# Patient Record
Sex: Female | Born: 1975 | Race: Black or African American | Hispanic: No | Marital: Single | Smoking: Never smoker
Health system: Southern US, Community
[De-identification: ages and names within clinical notes are randomized; demographics above are authoritative.]

## PROBLEM LIST (undated history)

## (undated) ENCOUNTER — Inpatient Hospital Stay (HOSPITAL_COMMUNITY): Payer: Self-pay

## (undated) DIAGNOSIS — J45909 Unspecified asthma, uncomplicated: Secondary | ICD-10-CM

## (undated) DIAGNOSIS — A749 Chlamydial infection, unspecified: Secondary | ICD-10-CM

## (undated) DIAGNOSIS — G4730 Sleep apnea, unspecified: Secondary | ICD-10-CM

## (undated) DIAGNOSIS — O169 Unspecified maternal hypertension, unspecified trimester: Secondary | ICD-10-CM

## (undated) DIAGNOSIS — Z789 Other specified health status: Secondary | ICD-10-CM

## (undated) HISTORY — DX: Unspecified maternal hypertension, unspecified trimester: O16.9

## (undated) HISTORY — PX: INDUCED ABORTION: SHX677

## (undated) HISTORY — DX: Unspecified asthma, uncomplicated: J45.909

---
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
## 1999-09-01 ENCOUNTER — Inpatient Hospital Stay (HOSPITAL_COMMUNITY): Admission: AD | Admit: 1999-09-01 | Discharge: 1999-09-01 | Payer: Self-pay | Admitting: *Deleted

## 1999-12-08 ENCOUNTER — Inpatient Hospital Stay (HOSPITAL_COMMUNITY): Admission: AD | Admit: 1999-12-08 | Discharge: 1999-12-09 | Payer: Self-pay | Admitting: Obstetrics

## 1999-12-30 ENCOUNTER — Other Ambulatory Visit: Admission: RE | Admit: 1999-12-30 | Discharge: 1999-12-30 | Payer: Self-pay | Admitting: Obstetrics & Gynecology

## 1999-12-30 ENCOUNTER — Other Ambulatory Visit: Admission: RE | Admit: 1999-12-30 | Discharge: 1999-12-30 | Payer: Self-pay | Admitting: Gynecology

## 2000-03-04 ENCOUNTER — Emergency Department (HOSPITAL_COMMUNITY): Admission: EM | Admit: 2000-03-04 | Discharge: 2000-03-04 | Payer: Self-pay

## 2000-06-03 ENCOUNTER — Inpatient Hospital Stay (HOSPITAL_COMMUNITY): Admission: AD | Admit: 2000-06-03 | Discharge: 2000-06-03 | Payer: Self-pay | Admitting: *Deleted

## 2000-07-30 ENCOUNTER — Inpatient Hospital Stay (HOSPITAL_COMMUNITY): Admission: AD | Admit: 2000-07-30 | Discharge: 2000-08-01 | Payer: Self-pay | Admitting: Obstetrics and Gynecology

## 2000-08-03 ENCOUNTER — Encounter: Admission: RE | Admit: 2000-08-03 | Discharge: 2000-09-02 | Payer: Self-pay | Admitting: Obstetrics and Gynecology

## 2000-09-17 ENCOUNTER — Inpatient Hospital Stay (HOSPITAL_COMMUNITY): Admission: AD | Admit: 2000-09-17 | Discharge: 2000-09-17 | Payer: Self-pay | Admitting: Obstetrics & Gynecology

## 2000-10-11 ENCOUNTER — Emergency Department (HOSPITAL_COMMUNITY): Admission: EM | Admit: 2000-10-11 | Discharge: 2000-10-11 | Payer: Self-pay | Admitting: Emergency Medicine

## 2001-03-29 ENCOUNTER — Other Ambulatory Visit: Admission: RE | Admit: 2001-03-29 | Discharge: 2001-03-29 | Payer: Self-pay | Admitting: Family Medicine

## 2002-03-06 ENCOUNTER — Inpatient Hospital Stay (HOSPITAL_COMMUNITY): Admission: AD | Admit: 2002-03-06 | Discharge: 2002-03-06 | Payer: Self-pay | Admitting: Obstetrics and Gynecology

## 2002-03-17 ENCOUNTER — Emergency Department (HOSPITAL_COMMUNITY): Admission: EM | Admit: 2002-03-17 | Discharge: 2002-03-17 | Payer: Self-pay

## 2002-04-20 ENCOUNTER — Inpatient Hospital Stay (HOSPITAL_COMMUNITY): Admission: AD | Admit: 2002-04-20 | Discharge: 2002-04-20 | Payer: Self-pay | Admitting: *Deleted

## 2003-07-18 ENCOUNTER — Inpatient Hospital Stay (HOSPITAL_COMMUNITY): Admission: AD | Admit: 2003-07-18 | Discharge: 2003-07-18 | Payer: Self-pay | Admitting: Family Medicine

## 2003-08-01 ENCOUNTER — Ambulatory Visit (HOSPITAL_COMMUNITY): Admission: RE | Admit: 2003-08-01 | Discharge: 2003-08-01 | Payer: Self-pay | Admitting: Family Medicine

## 2003-08-06 ENCOUNTER — Other Ambulatory Visit: Admission: RE | Admit: 2003-08-06 | Discharge: 2003-08-06 | Payer: Self-pay | Admitting: Obstetrics and Gynecology

## 2003-08-14 ENCOUNTER — Emergency Department (HOSPITAL_COMMUNITY): Admission: EM | Admit: 2003-08-14 | Discharge: 2003-08-14 | Payer: Self-pay | Admitting: *Deleted

## 2004-04-05 ENCOUNTER — Emergency Department (HOSPITAL_COMMUNITY): Admission: EM | Admit: 2004-04-05 | Discharge: 2004-04-05 | Payer: Self-pay | Admitting: Emergency Medicine

## 2004-05-01 ENCOUNTER — Emergency Department (HOSPITAL_COMMUNITY): Admission: EM | Admit: 2004-05-01 | Discharge: 2004-05-01 | Payer: Self-pay | Admitting: Emergency Medicine

## 2005-02-18 ENCOUNTER — Other Ambulatory Visit: Admission: RE | Admit: 2005-02-18 | Discharge: 2005-02-18 | Payer: Self-pay | Admitting: Obstetrics and Gynecology

## 2005-04-03 ENCOUNTER — Emergency Department (HOSPITAL_COMMUNITY): Admission: EM | Admit: 2005-04-03 | Discharge: 2005-04-03 | Payer: Self-pay | Admitting: Emergency Medicine

## 2005-05-30 ENCOUNTER — Inpatient Hospital Stay (HOSPITAL_COMMUNITY): Admission: AD | Admit: 2005-05-30 | Discharge: 2005-05-30 | Payer: Self-pay | Admitting: Obstetrics and Gynecology

## 2005-06-08 ENCOUNTER — Ambulatory Visit (HOSPITAL_COMMUNITY): Admission: RE | Admit: 2005-06-08 | Discharge: 2005-06-08 | Payer: Self-pay | Admitting: Obstetrics and Gynecology

## 2005-06-14 ENCOUNTER — Ambulatory Visit: Payer: Self-pay | Admitting: *Deleted

## 2005-08-01 ENCOUNTER — Inpatient Hospital Stay (HOSPITAL_COMMUNITY): Admission: AD | Admit: 2005-08-01 | Discharge: 2005-08-01 | Payer: Self-pay | Admitting: Obstetrics and Gynecology

## 2005-10-03 ENCOUNTER — Inpatient Hospital Stay (HOSPITAL_COMMUNITY): Admission: AD | Admit: 2005-10-03 | Discharge: 2005-10-05 | Payer: Self-pay | Admitting: Obstetrics and Gynecology

## 2005-10-08 ENCOUNTER — Inpatient Hospital Stay (HOSPITAL_COMMUNITY): Admission: AD | Admit: 2005-10-08 | Discharge: 2005-10-09 | Payer: Self-pay | Admitting: Obstetrics & Gynecology

## 2005-10-17 ENCOUNTER — Inpatient Hospital Stay (HOSPITAL_COMMUNITY): Admission: AD | Admit: 2005-10-17 | Discharge: 2005-10-18 | Payer: Self-pay | Admitting: Obstetrics and Gynecology

## 2005-10-21 ENCOUNTER — Encounter: Admission: RE | Admit: 2005-10-21 | Discharge: 2005-11-20 | Payer: Self-pay | Admitting: Obstetrics and Gynecology

## 2005-11-21 ENCOUNTER — Encounter: Admission: RE | Admit: 2005-11-21 | Discharge: 2005-12-20 | Payer: Self-pay | Admitting: Obstetrics and Gynecology

## 2005-12-21 ENCOUNTER — Encounter: Admission: RE | Admit: 2005-12-21 | Discharge: 2006-01-20 | Payer: Self-pay | Admitting: Obstetrics and Gynecology

## 2006-01-21 ENCOUNTER — Encounter: Admission: RE | Admit: 2006-01-21 | Discharge: 2006-02-19 | Payer: Self-pay | Admitting: Obstetrics and Gynecology

## 2006-01-26 ENCOUNTER — Inpatient Hospital Stay (HOSPITAL_COMMUNITY): Admission: AD | Admit: 2006-01-26 | Discharge: 2006-01-26 | Payer: Self-pay | Admitting: Obstetrics and Gynecology

## 2006-02-20 ENCOUNTER — Encounter: Admission: RE | Admit: 2006-02-20 | Discharge: 2006-03-22 | Payer: Self-pay | Admitting: Obstetrics and Gynecology

## 2006-03-23 ENCOUNTER — Encounter: Admission: RE | Admit: 2006-03-23 | Discharge: 2006-04-22 | Payer: Self-pay | Admitting: Obstetrics and Gynecology

## 2006-10-28 ENCOUNTER — Emergency Department (HOSPITAL_COMMUNITY): Admission: EM | Admit: 2006-10-28 | Discharge: 2006-10-28 | Payer: Self-pay | Admitting: Emergency Medicine

## 2007-02-18 ENCOUNTER — Emergency Department (HOSPITAL_COMMUNITY): Admission: EM | Admit: 2007-02-18 | Discharge: 2007-02-18 | Payer: Self-pay | Admitting: Emergency Medicine

## 2008-03-24 ENCOUNTER — Emergency Department (HOSPITAL_COMMUNITY): Admission: EM | Admit: 2008-03-24 | Discharge: 2008-03-24 | Payer: Self-pay | Admitting: Emergency Medicine

## 2008-05-24 ENCOUNTER — Emergency Department (HOSPITAL_COMMUNITY): Admission: EM | Admit: 2008-05-24 | Discharge: 2008-05-24 | Payer: Self-pay | Admitting: Emergency Medicine

## 2010-03-08 NOTE — L&D Delivery Note (Signed)
Pt progressed rapidly after amniotomy to c/c/o.  Pt had an epidural. Pt pushed three times. She had a SVD over intact perineum in ROA position.  Placenta S/I. Baby weighed 5-10.  Baby to NBN.

## 2010-03-29 ENCOUNTER — Encounter: Payer: Self-pay | Admitting: Obstetrics and Gynecology

## 2010-05-11 ENCOUNTER — Inpatient Hospital Stay (HOSPITAL_COMMUNITY)
Admission: AD | Admit: 2010-05-11 | Discharge: 2010-05-11 | Disposition: A | Discharge status: Home / Self Care | Payer: BC Managed Care – PPO | Source: Ambulatory Visit | Attending: Obstetrics and Gynecology | Admitting: Obstetrics and Gynecology

## 2010-05-11 ENCOUNTER — Inpatient Hospital Stay (HOSPITAL_COMMUNITY): Payer: BC Managed Care – PPO

## 2010-05-11 DIAGNOSIS — O209 Hemorrhage in early pregnancy, unspecified: Secondary | ICD-10-CM | POA: Insufficient documentation

## 2010-06-08 LAB — RUBELLA ANTIBODY, IGM: Rubella: IMMUNE

## 2010-06-08 LAB — ANTIBODY SCREEN: Antibody Screen: NEGATIVE

## 2010-06-08 LAB — HIV ANTIBODY (ROUTINE TESTING W REFLEX): HIV: NONREACTIVE

## 2010-06-08 LAB — HEPATITIS B SURFACE ANTIGEN: Hepatitis B Surface Ag: NEGATIVE

## 2010-06-08 LAB — ABO/RH: RH Type: POSITIVE

## 2010-06-18 LAB — POCT RAPID STREP A (OFFICE): Streptococcus, Group A Screen (Direct): NEGATIVE

## 2010-07-24 NOTE — Discharge Summary (Signed)
Julie Copeland, Julie Copeland             ACCOUNT NO.:  [id number]   MEDICAL RECORD NO.:  [id number]          PATIENT TYPE:  INP   LOCATION:  9127                          FACILITY:  [facility]   PHYSICIAN:  Gerrit Friends. Aldona Bar, M.D.   DATE OF BIRTH:  20-May-1975   DATE OF ADMISSION:  10/03/2005  DATE OF DISCHARGE:  10/05/2005                                 DISCHARGE SUMMARY   DISCHARGE DIAGNOSES:  1.  Term pregnancy, delivered 8 pound 4 ounce female infant, Apgars 9 and 9.  2.  Blood type, B positive.  3.  Positive antenatal group B strep.   PROCEDURES:  1  Normal spontaneous delivery.  1.  Second-degree tear and repair.   SUMMARY:  This 35 year old female, gravida 4, para 1 with a due date of  August 5 was admitted in labor at term.  Her pregnancy was complicated by  pre-term contractions.  There was also a history of group B strep and indeed  there was a question of her cervical length perhaps needing a cerclage.  A  group B strep culture was done when the patient was approximately [redacted] weeks  pregnant and was positive.  The patient had a consultation with Dr. Gavin Potters,  who recommended no cerclage but treatment of her group B strep with  intravenous ampicillin, which was arranged through home care.  At [redacted] weeks  gestation, bacterial vaginosis was treated with oral Flagyl.  A fetal  fibronectin at [redacted] weeks gestation was negative.  This was done because the  patient was having some contractions.  The remainder of her pregnancy was  relatively benign.  After admission, she was begun on intravenous  intrapartum penicillin per protocol and she progressed rapidly after  ruptured membranes and had a delivery of an 8 pound 4 ounce female infant  with Apgars of 9 and 9 over second-degree tear, which was repaired without  difficulty.  Her postpartum course was benign with the exception of some  mild hypertension.  On the morning of July 31, her blood pressure was in the  range of 150-140/90.  The patient was  entirely asymptomatic.  Nonetheless,  she was begun on labetalol 200 mg every 8 hours for a total of 10 days and  was asked to stop by the office and have her pressure checked periodically.  She was also given a prescription for Tylox to use 1-2 every 4-6 hours as  needed for severe pain and Motrin 600 mg to use every 6 hours as needed for  cramping or less severe pain.  She will continue her prenatal vitamins.  Her  baby was doing well and was discharged with the mother.  Mother's discharge  hemoglobin was 11.6 with a white count of 10,900, platelet count of 141,000.  She was given all appropriate instructions per discharge brochure at the  time of discharge and understood all instructions well.   CONDITION ON DISCHARGE:  Improved.   FOLLOW-UP:  In the office in approximately 4 weeks hence.      Gerrit Friends. Aldona Bar, M.D.  Electronically Signed     RMW/MEDQ  D:  10/05/2005  T:  10/05/2005  Job:  [id number]

## 2010-08-04 ENCOUNTER — Other Ambulatory Visit (HOSPITAL_COMMUNITY): Payer: Self-pay | Admitting: Obstetrics and Gynecology

## 2010-08-04 DIAGNOSIS — Z3689 Encounter for other specified antenatal screening: Secondary | ICD-10-CM

## 2010-08-27 ENCOUNTER — Ambulatory Visit (HOSPITAL_COMMUNITY): Payer: BC Managed Care – PPO

## 2010-09-09 ENCOUNTER — Inpatient Hospital Stay (HOSPITAL_COMMUNITY)
Admission: AD | Admit: 2010-09-09 | Discharge: 2010-09-09 | Disposition: A | Discharge status: Home / Self Care | Payer: BC Managed Care – PPO | Source: Ambulatory Visit | Attending: Obstetrics & Gynecology | Admitting: Obstetrics & Gynecology

## 2010-09-09 DIAGNOSIS — R109 Unspecified abdominal pain: Secondary | ICD-10-CM | POA: Insufficient documentation

## 2010-09-09 DIAGNOSIS — K5289 Other specified noninfective gastroenteritis and colitis: Secondary | ICD-10-CM | POA: Insufficient documentation

## 2010-09-09 DIAGNOSIS — O99891 Other specified diseases and conditions complicating pregnancy: Secondary | ICD-10-CM | POA: Insufficient documentation

## 2010-09-09 DIAGNOSIS — O9989 Other specified diseases and conditions complicating pregnancy, childbirth and the puerperium: Secondary | ICD-10-CM

## 2010-09-09 LAB — URINALYSIS, ROUTINE W REFLEX MICROSCOPIC
Bilirubin Urine: NEGATIVE
Glucose, UA: NEGATIVE mg/dL
Hgb urine dipstick: NEGATIVE
Leukocytes, UA: NEGATIVE
Nitrite: NEGATIVE
Protein, ur: NEGATIVE mg/dL

## 2010-10-08 ENCOUNTER — Telehealth: Payer: Self-pay | Admitting: Pulmonary Disease

## 2010-10-08 NOTE — Telephone Encounter (Signed)
Pt was referred by Dr. Ilda Mori, OBGYN.  Called and spoke with pt. Pt scheduled for Pulmonary Consult with [facility] for Friday at 11:30 am.

## 2010-10-09 ENCOUNTER — Ambulatory Visit (INDEPENDENT_AMBULATORY_CARE_PROVIDER_SITE_OTHER): Payer: BC Managed Care – PPO | Admitting: Pulmonary Disease

## 2010-10-09 ENCOUNTER — Encounter: Payer: Self-pay | Admitting: Pulmonary Disease

## 2010-10-09 VITALS — BP 150/72 | HR 100 | Temp 98.7°F | Ht 67.0 in | Wt 234.4 lb

## 2010-10-09 DIAGNOSIS — G4733 Obstructive sleep apnea (adult) (pediatric): Secondary | ICD-10-CM

## 2010-10-09 NOTE — Progress Notes (Signed)
  Subjective:    Patient ID: Julie Copeland, female    DOB: March 22, 1975, 35 y.o.   MRN: [id number]  HPI The pt is a 35y/o female who I have been asked to see for possible sleep apnea.  She is 30 weeks with IUP, and the question has been raised by spouse and healthworkers.   Her history is significant for the following: -snoring that has worsened since pregnancy, along with an abnormal breathing pattern during sleep. -frequent awakenings, but sometimes due to being uncomfortable or having issues breathing. -nonrestorative sleep, and significant daytime sleepiness with periods of inactivity -dozes with tv/movies, takes naps in afternoons -no sleepiness with driving. -weight has increased 15 pounds over the last 2 yrs  Sleep Questionnaire: What time do you typically go to bed?( Between what hours) Midnight How long does it take you to fall asleep? 1 hour How many times during the night do you wake up? 5 What time do you get out of bed to start your day? 0700 Do you drive or operate heavy machinery in your occupation? No How much has your weight changed (up or down) over the past two years? (In pounds) 15 lb (6.804 kg) Have you ever had a sleep study before? No Do you currently use CPAP? No Do you wear oxygen at any time? No    Review of Systems  Constitutional: Negative for fever and unexpected weight change.  HENT: Negative for ear pain, nosebleeds, congestion, sore throat, rhinorrhea, sneezing, trouble swallowing, dental problem, postnasal drip and sinus pressure.   Eyes: Negative for redness and itching.  Respiratory: Positive for shortness of breath. Negative for cough, chest tightness and wheezing.   Cardiovascular: Negative for palpitations and leg swelling.  Gastrointestinal: Negative for nausea and vomiting.  Genitourinary: Negative for dysuria.  Musculoskeletal: Negative for joint swelling.  Skin: Negative for rash.  Neurological: Negative for headaches.  Hematological: Does not  bruise/bleed easily.  Psychiatric/Behavioral: Negative for dysphoric mood. The patient is not nervous/anxious.        Objective:   Physical Exam Constitutional:  Pregnant female, no acute distress  HENT:  Nares patent without discharge, but enlarged turbinates  Oropharynx without exudate, palate very elongated, uvula normal  Eyes:  Perrla, eomi, no scleral icterus  Neck:  No JVD, no TMG  Cardiovascular:  tachycardic, regular rhythm, no rubs or gallops.  No murmurs        Intact distal pulses  Pulmonary :  Normal breath sounds, no stridor or respiratory distress   No rales, rhonchi, or wheezing  Abdominal:  Soft, nondistended, bowel sounds present.  No tenderness noted.   Musculoskeletal:  No lower extremity edema noted.  Lymph Nodes:  No cervical lymphadenopathy noted  Skin:  No cyanosis noted  Neurologic:  Alert, appropriate, moves all 4 extremities without obvious deficit.         Assessment & Plan:

## 2010-10-09 NOTE — Patient Instructions (Signed)
Will try and get home sleep testing precertified with your insurance.  If able, will contact you once results available.  If not, then will schedule for study at sleep center.

## 2010-10-15 ENCOUNTER — Encounter: Payer: Self-pay | Admitting: Pulmonary Disease

## 2010-10-15 NOTE — Assessment & Plan Note (Signed)
The pt's history is suggestive of osa, and with her pregnancy, will be important to make sure she is not having significant nocturnal desaturations with this.  I have had a long discussion with the pt about sleep apnea, including its impact on QOL and CV health. I think her history is concerning enough to proceed with a sleep study, and she agrees.

## 2010-10-19 ENCOUNTER — Telehealth: Payer: Self-pay | Admitting: Pulmonary Disease

## 2010-10-19 NOTE — Telephone Encounter (Signed)
KC sent order to [facility] on 8/3 for home sleep study.  Called and spoke with pt.  Pt states she still hasn't heard from our office regarding getting set up with the apnea link.  PCCs, please advise asap.  Thanks.

## 2010-10-20 NOTE — Telephone Encounter (Signed)
Attempted to contact patient on 10/16/10 to arrange for home sleep study and the phone number I was given was temporary disconnected. [employer] has approved the home sleep study and will contact patient today to arrange.

## 2010-10-20 NOTE — Telephone Encounter (Signed)
Contacted patient and advised patient that [employer] did approve study and home sleep study has been set up for Thurs. 10/22/10. Pt will come after 1:00. Pt is aware.

## 2010-10-23 ENCOUNTER — Ambulatory Visit (INDEPENDENT_AMBULATORY_CARE_PROVIDER_SITE_OTHER): Payer: BC Managed Care – PPO | Admitting: Pulmonary Disease

## 2010-10-23 DIAGNOSIS — G4733 Obstructive sleep apnea (adult) (pediatric): Secondary | ICD-10-CM

## 2010-11-02 ENCOUNTER — Telehealth: Payer: Self-pay | Admitting: Pulmonary Disease

## 2010-11-02 NOTE — Progress Notes (Signed)
The patient underwent home sleep testing with a type III portable device.  Airflow, effort, oxygen saturation, and pulse were all monitored during this time.  The patient's raw data and tracings have been reviewed with the following findings:  #1 flow evaluation time of 5 hours 47 minutes. #2 the patient had no apneas, and only 9 obstructive hypopneas.  This gave her an apnea hypopnea index of 2 events per hour. #3 the patient had oxygen desaturation as low as 79%.  She spent 13 minutes less than or equal to 88%.

## 2010-11-02 NOTE — Telephone Encounter (Signed)
Called patient on her home and cell phone numbers, left message on machine to call us back so that we could discuss her sleep study results.

## 2010-11-02 NOTE — Assessment & Plan Note (Signed)
The patient does not have clinically significant sleep apnea, but does have desaturation which would be best treated with oxygen in the setting of pregnancy.  We'll start the patient on supplemental oxygen just during her pregnancy, and suspect she will not require this once her baby is delivered.

## 2010-11-05 ENCOUNTER — Other Ambulatory Visit: Payer: Self-pay | Admitting: Pulmonary Disease

## 2010-11-05 DIAGNOSIS — G4736 Sleep related hypoventilation in conditions classified elsewhere: Secondary | ICD-10-CM | POA: Insufficient documentation

## 2010-11-05 NOTE — Telephone Encounter (Signed)
Pt had very transient desat to 79%, and less than 88% No significant sleep apnea. Will arrange for oxygen until after her delivery date, then d/c.  Pt made aware, order sent to [facility].

## 2010-11-12 ENCOUNTER — Encounter: Payer: Self-pay | Admitting: Pulmonary Disease

## 2010-11-13 ENCOUNTER — Encounter (HOSPITAL_COMMUNITY): Payer: Self-pay | Admitting: *Deleted

## 2010-11-13 ENCOUNTER — Observation Stay (HOSPITAL_COMMUNITY)
Admission: AD | Admit: 2010-11-13 | Discharge: 2010-11-13 | Disposition: A | Discharge status: Home / Self Care | Payer: BC Managed Care – PPO | Source: Ambulatory Visit | Attending: Obstetrics and Gynecology | Admitting: Obstetrics and Gynecology

## 2010-11-13 DIAGNOSIS — IMO0002 Reserved for concepts with insufficient information to code with codable children: Secondary | ICD-10-CM | POA: Insufficient documentation

## 2010-11-13 DIAGNOSIS — R030 Elevated blood-pressure reading, without diagnosis of hypertension: Secondary | ICD-10-CM | POA: Insufficient documentation

## 2010-11-13 DIAGNOSIS — O99891 Other specified diseases and conditions complicating pregnancy: Principal | ICD-10-CM | POA: Insufficient documentation

## 2010-11-13 HISTORY — DX: Other specified health status: Z78.9

## 2010-11-13 LAB — URINALYSIS, MICROSCOPIC ONLY
Protein, ur: NEGATIVE mg/dL
Urobilinogen, UA: 1 mg/dL (ref 0.0–1.0)

## 2010-11-13 LAB — COMPREHENSIVE METABOLIC PANEL
Calcium: 8.9 mg/dL (ref 8.4–10.5)
GFR calc Af Amer: >60 mL/min (ref >60)
Glucose, Bld: 64 mg/dL — ABNORMAL LOW (ref 70–99)
Potassium: 3.5 mEq/L (ref 3.5–5.1)
Sodium: 133 mEq/L — ABNORMAL LOW (ref 135–145)
Total Protein: 5.7 g/dL — ABNORMAL LOW (ref 6.0–8.3)

## 2010-11-13 LAB — URIC ACID: Uric Acid, Serum: 4.8 mg/dL (ref 2.4–7.0)

## 2010-11-13 LAB — CBC
MCH: 32.2 pg (ref 26.0–34.0)
RBC: 3.79 MIL/uL — ABNORMAL LOW (ref 3.87–5.11)

## 2010-11-13 NOTE — Progress Notes (Signed)
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

## 2010-11-27 NOTE — Progress Notes (Signed)
Encounter addended by: Elaina Hoops, RN on: 11/27/2010 11:45 PM<BR>     Documentation filed: Charges VN

## 2010-12-01 ENCOUNTER — Inpatient Hospital Stay (HOSPITAL_COMMUNITY): Payer: BC Managed Care – PPO

## 2010-12-01 ENCOUNTER — Encounter (HOSPITAL_COMMUNITY): Payer: Self-pay | Admitting: *Deleted

## 2010-12-01 ENCOUNTER — Inpatient Hospital Stay (HOSPITAL_COMMUNITY)
Admission: AD | Admit: 2010-12-01 | Discharge: 2010-12-04 | DRG: 372 | Disposition: A | Discharge status: Home / Self Care | Payer: BC Managed Care – PPO | Source: Ambulatory Visit | Attending: Obstetrics & Gynecology | Admitting: Obstetrics & Gynecology

## 2010-12-01 DIAGNOSIS — Z22330 Carrier of Group B streptococcus: Secondary | ICD-10-CM

## 2010-12-01 DIAGNOSIS — O99892 Other specified diseases and conditions complicating childbirth: Secondary | ICD-10-CM | POA: Diagnosis present

## 2010-12-01 DIAGNOSIS — O139 Gestational [pregnancy-induced] hypertension without significant proteinuria, unspecified trimester: Principal | ICD-10-CM | POA: Diagnosis present

## 2010-12-01 HISTORY — DX: Chlamydial infection, unspecified: A74.9

## 2010-12-01 LAB — URINALYSIS, ROUTINE W REFLEX MICROSCOPIC
Glucose, UA: NEGATIVE mg/dL
Nitrite: NEGATIVE
Protein, ur: NEGATIVE mg/dL
Urobilinogen, UA: 0.2 mg/dL (ref 0.0–1.0)

## 2010-12-01 LAB — COMPREHENSIVE METABOLIC PANEL
ALT: 20 U/L (ref 0–35)
AST: 28 U/L (ref 0–37)
BUN: 9 mg/dL (ref 6–23)
Chloride: 101 mEq/L (ref 96–112)
Creatinine, Ser: 0.49 mg/dL — ABNORMAL LOW (ref 0.50–1.10)
GFR calc non Af Amer: >60 mL/min (ref >60)
Glucose, Bld: 100 mg/dL — ABNORMAL HIGH (ref 70–99)
Total Protein: 6.2 g/dL (ref 6.0–8.3)

## 2010-12-01 LAB — CBC
HCT: 37 % (ref 36.0–46.0)
Platelets: 163 10*3/uL (ref 150–400)
WBC: 7.3 10*3/uL (ref 4.0–10.5)

## 2010-12-01 LAB — URIC ACID: Uric Acid, Serum: 5.1 mg/dL (ref 2.4–7.0)

## 2010-12-01 NOTE — ED Provider Notes (Signed)
Chief Complaint:  Hypertension   Julie Copeland is  35 y.o. [phone].  [redacted]w[redacted]d    She presents complaining of Hypertension Pt states that she was in the office today and Dr. Arlyce Dice had instructed her to come into MAU tonight for additional monitoring of the baby. Denies HA, visual disturbance, epigastric pain, nausea or vomiting. Report good FM  Obstetrical/Gynecological History: OB History    Grav Para Term Preterm Abortions TAB SAB Ect Mult Living   7 2 2  0 3 2 1  0 0 2      Past Medical History: Past Medical History  Diagnosis Date  . Hypertension in pregnancy   . Childhood asthma   . No pertinent past medical history     Past Surgical History: Past Surgical History  Procedure Date  . Wisdom tooth extraction 1997  . Induced abortion 2000 and 2006  . No past surgeries     Family History: Family History  Problem Relation Age of Onset  . Heart disease Mother   . Heart disease Father   . Asthma Brother     Social History: History  Substance Use Topics  . Smoking status: Never Smoker   . Smokeless tobacco: Never Used  . Alcohol Use: No    Allergies: No Known Allergies  Prescriptions prior to admission  Medication Sig Dispense Refill  . prenatal vitamin w/FE, FA (PRENATAL 1 + 1) 27-1 MG TABS Take 1 tablet by mouth daily.        . Prenatal Vit-Fe Fumarate-FA (PRENATAL MULTIVITAMIN) 60-1 MG tablet Take 1 tablet by mouth daily.          Review of Systems - Negative except for what has been reviewed in the HPI  Physical Exam   Blood pressure 150/84, pulse 99, temperature 98.3 F (36.8 C), resp. rate 20, height 5\' 5"  (1.651 m), weight 107.049 kg (236 lb).  General: General appearance - alert, well appearing, and in no distress, oriented to person, place, and time and pregnant, facial edema Nose - normal and patent, no erythema, discharge or polyps Abdomen - soft, nontender, nondistended, no masses or organomegaly gravid Focused Gynecological Exam: examination  not indicated FHR: 130, mod variability, + 10 x10 accels. Non-reactive Toco: no ctx  Labs: Recent Results (from the past 24 hour(s))  CBC   Collection Time   12/01/10 10:42 PM      Component Value Range   WBC 7.3  4.0 - 10.5 (K/uL)   RBC 3.87  3.87 - 5.11 (MIL/uL)   Hemoglobin 12.5  12.0 - 15.0 (g/dL)   HCT 45.4  09.8 - 11.9 (%)   MCV 95.6  78.0 - 100.0 (fL)   MCH 32.3  26.0 - 34.0 (pg)   MCHC 33.8  30.0 - 36.0 (g/dL)   RDW 14.7  82.9 - 56.2 (%)   Platelets 163  150 - 400 (K/uL)  COMPREHENSIVE METABOLIC PANEL   Collection Time   12/01/10 10:42 PM      Component Value Range   Sodium 133 (*) 135 - 145 (mEq/L)   Potassium 3.6  3.5 - 5.1 (mEq/L)   Chloride 101  96 - 112 (mEq/L)   CO2 23  19 - 32 (mEq/L)   Glucose, Bld 100 (*) 70 - 99 (mg/dL)   BUN 9  6 - 23 (mg/dL)   Creatinine, Ser 1.30 (*) 0.50 - 1.10 (mg/dL)   Calcium 9.4  8.4 - 86.5 (mg/dL)   Total Protein 6.2  6.0 - 8.3 (g/dL)  Albumin 2.6 (*) 3.5 - 5.2 (g/dL)   AST 28  0 - 37 (U/L)   ALT 20  0 - 35 (U/L)   Alkaline Phosphatase 177 (*) 39 - 117 (U/L)   Total Bilirubin 0.4  0.3 - 1.2 (mg/dL)   GFR calc non Af Amer >60  >60 (mL/min)   GFR calc Af Amer >60  >60 (mL/min)  URIC ACID   Collection Time   12/01/10 10:42 PM      Component Value Range   Uric Acid, Serum 5.1  2.4 - 7.0 (mg/dL)   Imaging Studies:  No results found.   MD Consult: Discussed with Dr. Arlyce Dice. Orders received for BPP and Pre-x labs.  Assessment: Patient Active Problem List  Diagnoses  . OSA (obstructive sleep apnea)  . Nocturnal hypoxemia due to emphysema    Plan: Care assumed by Georges Mouse, CNM  [facility] E. 12/01/2010,11:46 PM    Results for orders placed during the hospital encounter of 12/01/10 (from the past 24 hour(s))  CBC     Status: Normal   Collection Time   12/01/10 10:42 PM      Component Value Range   WBC 7.3  4.0 - 10.5 (K/uL)   RBC 3.87  3.87 - 5.11 (MIL/uL)   Hemoglobin 12.5  12.0 - 15.0 (g/dL)   HCT 40.9   81.1 - 91.4 (%)   MCV 95.6  78.0 - 100.0 (fL)   MCH 32.3  26.0 - 34.0 (pg)   MCHC 33.8  30.0 - 36.0 (g/dL)   RDW 78.2  95.6 - 21.3 (%)   Platelets 163  150 - 400 (K/uL)  COMPREHENSIVE METABOLIC PANEL     Status: Abnormal   Collection Time   12/01/10 10:42 PM      Component Value Range   Sodium 133 (*) 135 - 145 (mEq/L)   Potassium 3.6  3.5 - 5.1 (mEq/L)   Chloride 101  96 - 112 (mEq/L)   CO2 23  19 - 32 (mEq/L)   Glucose, Bld 100 (*) 70 - 99 (mg/dL)   BUN 9  6 - 23 (mg/dL)   Creatinine, Ser 0.86 (*) 0.50 - 1.10 (mg/dL)   Calcium 9.4  8.4 - 57.8 (mg/dL)   Total Protein 6.2  6.0 - 8.3 (g/dL)   Albumin 2.6 (*) 3.5 - 5.2 (g/dL)   AST 28  0 - 37 (U/L)   ALT 20  0 - 35 (U/L)   Alkaline Phosphatase 177 (*) 39 - 117 (U/L)   Total Bilirubin 0.4  0.3 - 1.2 (mg/dL)   GFR calc non Af Amer >60  >60 (mL/min)   GFR calc Af Amer >60  >60 (mL/min)  URIC ACID     Status: Normal   Collection Time   12/01/10 10:42 PM      Component Value Range   Uric Acid, Serum 5.1  2.4 - 7.0 (mg/dL)  URINALYSIS, ROUTINE W REFLEX MICROSCOPIC     Status: Normal   Collection Time   12/01/10 11:15 PM      Component Value Range   Color, Urine YELLOW  YELLOW    Appearance CLEAR  CLEAR    Specific Gravity, Urine 1.025  1.005 - 1.030    pH 6.5  5.0 - 8.0    Glucose, UA NEGATIVE  NEGATIVE (mg/dL)   Hgb urine dipstick NEGATIVE  NEGATIVE    Bilirubin Urine NEGATIVE  NEGATIVE    Ketones, ur NEGATIVE  NEGATIVE (mg/dL)   Protein, ur NEGATIVE  NEGATIVE (mg/dL)   Urobilinogen, UA 0.2  0.0 - 1.0 (mg/dL)   Nitrite NEGATIVE  NEGATIVE    Leukocytes, UA NEGATIVE  NEGATIVE     US Fetal Bpp W/o Non Stress  12/02/2010  OBSTETRICAL ULTRASOUND: This exam was performed within a [place] [facility]. The OB US report was generated in the [facility] system, and faxed to the ordering physician.   This report is also available in [employer] and in the [employer]. See [facility] Obstetric US report.  BPP 8/8,  subjectively low-normal amniotic fluid  NST remains non-reactive with + 10x10 accels, toco quiet  A/P: 35 y.o. [id number] at [redacted]w[redacted]d Gestational hypertension Rev'd with Dr. Arlyce Dice, recommends admission overnight with Cytotec for cervical ripening. Will monitor BPs and determine further plan of care in the AM.

## 2010-12-01 NOTE — Progress Notes (Signed)
Pt states she was seen in the office today and told to come in tonight about 2000.

## 2010-12-01 NOTE — Progress Notes (Signed)
Pt reports she was told to come over here at her office visit today, to have baby monitored and to have an u/s for BPP. States her b/p was elevated today in office

## 2010-12-02 ENCOUNTER — Encounter (HOSPITAL_COMMUNITY): Payer: Self-pay | Admitting: Anesthesiology

## 2010-12-02 ENCOUNTER — Inpatient Hospital Stay (HOSPITAL_COMMUNITY): Payer: BC Managed Care – PPO | Admitting: Anesthesiology

## 2010-12-02 ENCOUNTER — Encounter (HOSPITAL_COMMUNITY): Payer: Self-pay | Admitting: *Deleted

## 2010-12-02 ENCOUNTER — Encounter (HOSPITAL_COMMUNITY): Payer: Self-pay | Admitting: Obstetrics and Gynecology

## 2010-12-02 LAB — CBC
HCT: 38.9 % (ref 36.0–46.0)
Hemoglobin: 13.3 g/dL (ref 12.0–15.0)
MCH: 32.8 pg (ref 26.0–34.0)
MCHC: 34.2 g/dL (ref 30.0–36.0)
Platelets: 162 10*3/uL (ref 150–400)
RBC: 4.07 MIL/uL (ref 3.87–5.11)
RDW: 14.3 % (ref 11.5–15.5)
RDW: 14.1 % (ref 11.5–15.5)
WBC: 6 10*3/uL (ref 4.0–10.5)

## 2010-12-02 LAB — STREP B DNA PROBE: GBS: POSITIVE

## 2010-12-02 MED ORDER — LACTATED RINGERS IV SOLN: 500.0000 mL | INTRAVENOUS | Status: DC | PRN

## 2010-12-02 MED ORDER — EPHEDRINE 5 MG/ML INJ
10.0000 mg | INTRAVENOUS | Status: DC | PRN
  Filled 2010-12-02 (×2): qty 4

## 2010-12-02 MED ORDER — WITCH HAZEL-GLYCERIN EX PADS: 1.0000 "application " | MEDICATED_PAD | CUTANEOUS | Status: DC | PRN

## 2010-12-02 MED ORDER — PENICILLIN G POTASSIUM 5000000 UNITS IJ SOLR
2.5000 10*6.[IU] | INTRAVENOUS | Status: DC
  Administered 2010-12-02 (×2): 2.5 10*6.[IU] via INTRAVENOUS
  Filled 2010-12-02 (×6): qty 2.5

## 2010-12-02 MED ORDER — LIDOCAINE HCL 1.5 % IJ SOLN
INTRAMUSCULAR | Status: DC | PRN
  Administered 2010-12-02 (×2): 5 mL via EPIDURAL

## 2010-12-02 MED ORDER — FENTANYL 2.5 MCG/ML BUPIVACAINE 1/10 % EPIDURAL INFUSION (WH - ANES)
14.0000 mL/h | INTRAMUSCULAR | Status: DC
  Filled 2010-12-02: qty 60

## 2010-12-02 MED ORDER — IBUPROFEN 600 MG PO TABS: 600.0000 mg | ORAL_TABLET | Freq: Four times a day (QID) | ORAL | Status: DC | PRN

## 2010-12-02 MED ORDER — LABETALOL HCL 200 MG PO TABS
200.0000 mg | ORAL_TABLET | Freq: Once | ORAL | Status: AC
  Administered 2010-12-02: 200 mg via ORAL
  Filled 2010-12-02: qty 1

## 2010-12-02 MED ORDER — OXYTOCIN 20 UNITS IN LACTATED RINGERS INFUSION - SIMPLE
125.0000 mL/h | Freq: Once | INTRAVENOUS | Status: DC
  Administered 2010-12-02: 125 mL/h via INTRAVENOUS

## 2010-12-02 MED ORDER — ONDANSETRON HCL 4 MG/2ML IJ SOLN: 4.0000 mg | Freq: Four times a day (QID) | INTRAMUSCULAR | Status: DC | PRN

## 2010-12-02 MED ORDER — DIPHENHYDRAMINE HCL 50 MG/ML IJ SOLN: 12.5000 mg | INTRAMUSCULAR | Status: DC | PRN

## 2010-12-02 MED ORDER — MISOPROSTOL 25 MCG QUARTER TABLET
25.0000 ug | ORAL_TABLET | ORAL | Status: DC | PRN
  Administered 2010-12-02: 25 ug via VAGINAL
  Filled 2010-12-02: qty 0.25
  Filled 2010-12-02: qty 1

## 2010-12-02 MED ORDER — FENTANYL 2.5 MCG/ML BUPIVACAINE 1/10 % EPIDURAL INFUSION (WH - ANES)
INTRAMUSCULAR | Status: DC | PRN
  Administered 2010-12-02: 14 mL/h via EPIDURAL

## 2010-12-02 MED ORDER — IBUPROFEN 600 MG PO TABS
600.0000 mg | ORAL_TABLET | Freq: Four times a day (QID) | ORAL | Status: DC
  Administered 2010-12-02 – 2010-12-04 (×6): 600 mg via ORAL
  Filled 2010-12-02 (×6): qty 1

## 2010-12-02 MED ORDER — EPHEDRINE 5 MG/ML INJ
10.0000 mg | INTRAVENOUS | Status: DC | PRN
  Filled 2010-12-02: qty 4

## 2010-12-02 MED ORDER — CITRIC ACID-SODIUM CITRATE 334-500 MG/5ML PO SOLN: 30.0000 mL | ORAL | Status: DC | PRN

## 2010-12-02 MED ORDER — FLEET ENEMA 7-19 GM/118ML RE ENEM: 1.0000 | ENEMA | RECTAL | Status: DC | PRN

## 2010-12-02 MED ORDER — ONDANSETRON HCL 4 MG/2ML IJ SOLN: 4.0000 mg | INTRAMUSCULAR | Status: DC | PRN

## 2010-12-02 MED ORDER — MEASLES, MUMPS & RUBELLA VAC ~~LOC~~ INJ: 0.5000 mL | INJECTION | Freq: Once | SUBCUTANEOUS | Status: DC

## 2010-12-02 MED ORDER — OXYCODONE-ACETAMINOPHEN 5-325 MG PO TABS: 2.0000 | ORAL_TABLET | ORAL | Status: DC | PRN

## 2010-12-02 MED ORDER — PENICILLIN G POTASSIUM 5000000 UNITS IJ SOLR
5.0000 10*6.[IU] | Freq: Once | INTRAVENOUS | Status: DC
  Administered 2010-12-02: 5 10*6.[IU] via INTRAVENOUS
  Filled 2010-12-02: qty 5

## 2010-12-02 MED ORDER — DIBUCAINE 1 % RE OINT: 1.0000 "application " | TOPICAL_OINTMENT | RECTAL | Status: DC | PRN

## 2010-12-02 MED ORDER — BUTORPHANOL TARTRATE 2 MG/ML IJ SOLN
1.0000 mg | INTRAMUSCULAR | Status: DC | PRN
  Administered 2010-12-02 (×2): 1 mg via INTRAVENOUS
  Filled 2010-12-02 (×2): qty 1

## 2010-12-02 MED ORDER — TERBUTALINE SULFATE 1 MG/ML IJ SOLN: 0.2500 mg | Freq: Once | INTRAMUSCULAR | Status: DC | PRN

## 2010-12-02 MED ORDER — OXYCODONE-ACETAMINOPHEN 5-325 MG PO TABS
1.0000 | ORAL_TABLET | ORAL | Status: DC | PRN
  Administered 2010-12-02 – 2010-12-04 (×4): 1 via ORAL
  Filled 2010-12-02 (×4): qty 1

## 2010-12-02 MED ORDER — PHENYLEPHRINE 40 MCG/ML (10ML) SYRINGE FOR IV PUSH (FOR BLOOD PRESSURE SUPPORT)
80.0000 ug | PREFILLED_SYRINGE | INTRAVENOUS | Status: DC | PRN
  Filled 2010-12-02 (×2): qty 5

## 2010-12-02 MED ORDER — LACTATED RINGERS IV SOLN
INTRAVENOUS | Status: DC
  Administered 2010-12-02 (×4): via INTRAVENOUS

## 2010-12-02 MED ORDER — ACETAMINOPHEN 325 MG PO TABS: 650.0000 mg | ORAL_TABLET | ORAL | Status: DC | PRN

## 2010-12-02 MED ORDER — LACTATED RINGERS IV SOLN: 500.0000 mL | Freq: Once | INTRAVENOUS | Status: DC

## 2010-12-02 MED ORDER — LIDOCAINE HCL (PF) 1 % IJ SOLN
30.0000 mL | INTRAMUSCULAR | Status: DC | PRN
  Filled 2010-12-02 (×2): qty 30

## 2010-12-02 MED ORDER — BENZOCAINE-MENTHOL 20-0.5 % EX AERO: 1.0000 "application " | INHALATION_SPRAY | CUTANEOUS | Status: DC | PRN

## 2010-12-02 MED ORDER — PHENYLEPHRINE 40 MCG/ML (10ML) SYRINGE FOR IV PUSH (FOR BLOOD PRESSURE SUPPORT)
80.0000 ug | PREFILLED_SYRINGE | INTRAVENOUS | Status: DC | PRN
  Filled 2010-12-02: qty 5

## 2010-12-02 MED ORDER — OXYTOCIN 20 UNITS IN LACTATED RINGERS INFUSION - SIMPLE: 1.0000 m[IU]/min | INTRAVENOUS | Status: DC

## 2010-12-02 MED ORDER — ONDANSETRON HCL 4 MG PO TABS: 4.0000 mg | ORAL_TABLET | ORAL | Status: DC | PRN

## 2010-12-02 MED ORDER — ZOLPIDEM TARTRATE 5 MG PO TABS: 5.0000 mg | ORAL_TABLET | Freq: Every evening | ORAL | Status: DC | PRN

## 2010-12-02 MED ORDER — OXYTOCIN BOLUS FROM INFUSION
500.0000 mL | Freq: Once | INTRAVENOUS | Status: DC
  Filled 2010-12-02: qty 1000
  Filled 2010-12-02: qty 500

## 2010-12-02 MED ORDER — SIMETHICONE 80 MG PO CHEW: 80.0000 mg | CHEWABLE_TABLET | ORAL | Status: DC | PRN

## 2010-12-02 MED ORDER — TETANUS-DIPHTH-ACELL PERTUSSIS 5-2.5-18.5 LF-MCG/0.5 IM SUSP
0.5000 mL | Freq: Once | INTRAMUSCULAR | Status: AC
  Administered 2010-12-03: 0.5 mL via INTRAMUSCULAR
  Filled 2010-12-02: qty 0.5

## 2010-12-02 NOTE — Progress Notes (Signed)
After placing Cytotec, pt asks "are they wanting me to delivery this baby soon?"Pt also stated she was 2 weeks early and did not understand why she was being induced.  I informed pt that she was being induced for increased BP and may delivery in the next 24 hours. Pt stated she would would talk with her primary doctor (Dr Dareen Piano) in the am.

## 2010-12-02 NOTE — Anesthesia Procedure Notes (Addendum)
Epidural Patient location during procedure: OB Start time: 12/02/2010 12:19 PM End time: 12/02/2010 12:26 PM Reason for block: procedure for pain  Staffing Anesthesiologist: Sandrea Hughs Performed by: anesthesiologist   Preanesthetic Checklist Completed: patient identified, site marked, surgical consent, pre-op evaluation, timeout performed, IV checked, risks and benefits discussed and monitors and equipment checked  Epidural Patient position: sitting Prep: site prepped and draped and DuraPrep Patient monitoring: continuous pulse ox and blood pressure Approach: midline Injection technique: LOR air  Needle:  Needle type: Tuohy  Needle gauge: 17 G Needle length: 9 cm Needle insertion depth: 7 cm Catheter type: closed end flexible Catheter size: 19 Gauge Catheter at skin depth: 13 cm Test dose: negative and 1.5% lidocaine  Assessment Sensory level: T10 Events: blood not aspirated, injection not painful, no injection resistance, negative IV test and no paresthesia

## 2010-12-02 NOTE — Anesthesia Postprocedure Evaluation (Signed)
Anesthesia Post Note  Patient: Julie Copeland  Procedure(s) Performed: * No procedures listed *  Anesthesia type: Epidural  Patient location: Mother/Baby  Post pain: Pain level controlled  Post assessment: Post-op Vital signs reviewed  Last Vitals:  Filed Vitals:   12/02/10 1540  BP: 167/106  Pulse: 69  Temp: 98.1 F (36.7 C)  Resp: 18    Post vital signs: Reviewed  Level of consciousness: awake  Complications: No apparent anesthesia complications

## 2010-12-02 NOTE — Progress Notes (Signed)
Elevated bp. Denies headaches, dizziness. Dr. Dareen Piano on call, notified of Bp. New parameters received.

## 2010-12-02 NOTE — Anesthesia Preprocedure Evaluation (Signed)
Anesthesia Evaluation  Name, MR# and DOB Patient awake  General Assessment Comment  Reviewed: Allergy & Precautions, H&P , Patient's Chart, lab work & pertinent test results  Airway Mallampati: II TM Distance: >3 FB Neck ROM: full    Dental No notable dental hx.    Pulmonary  asthma  clear to auscultation  pulmonary exam normalPulmonary Exam Normal breath sounds clear to auscultation none    Cardiovascular hypertension, regular Normal    Neuro/Psych Negative Neurological ROS  Negative Psych ROS  GI/Hepatic/Renal negative GI ROS  negative Liver ROS  negative Renal ROS        Endo/Other  Negative Endocrine ROS (+)      Abdominal Normal abdominal exam  (+) obese,   Musculoskeletal negative musculoskeletal ROS (+)   Hematology negative hematology ROS (+)   Peds  Reproductive/Obstetrics (+) Pregnancy    Anesthesia Other Findings             Anesthesia Physical Anesthesia Plan  ASA: III  Anesthesia Plan: Epidural   Post-op Pain Management:    Induction:   Airway Management Planned:   Additional Equipment:   Intra-op Plan:   Post-operative Plan:   Informed Consent: I have reviewed the patients History and Physical, chart, labs and discussed the procedure including the risks, benefits and alternatives for the proposed anesthesia with the patient or authorized representative who has indicated his/her understanding and acceptance.     Plan Discussed with:   Anesthesia Plan Comments:         Anesthesia Quick Evaluation

## 2010-12-02 NOTE — H&P (Signed)
  35 y.o. G5P2  Estimated Date of Delivery: 12/16/10 admitted at [redacted] weeks gestation with PIH, non reactive NST, and subjectively low normal amniotic fluid volume. Prenatal course was complicated by elevated blood pressures over the last 3 weeks.  Today her NST in the office was not reactive (no accels above 10bpm).  She was sent to MAU where her NST did not become reactive.  Her BPP was 8/8 but the amniotic fluid was noted to appear subjectively decreased.   Prenatal labs: Blood Type:B+.  Screening tests for HIV, Syphilis, Hepatitis B, Rubella sensitivity, fetal anomalies, gestational diabetes were negative.  Her screening urine culture was positive for GBS.  Afebrile, VSS Heart and Lungs: No active disease Abdomen: soft, gravid, EFW 7 lbs. Cervical exam:  2 cm 70 % effaced but vertex is high.  Impression: PIH with non reassuring antepartum testing at 38 weeks.  Plan:  Admit and start Cytotec for cervical ripening and GBS prophylaxis.  Reevaluate in AM.  If BPs are normal, her tracing is reactive with no decels with contractions, and her cervix does not feel favorable then abandoning the induction could be considered.

## 2010-12-02 NOTE — Plan of Care (Signed)
Problem: Consults Goal: Birthing Suites Patient Information Press F2 to bring up selections list Outcome: Completed/Met Date Met:  12/02/10  Pt 37-[redacted] weeks EGA

## 2010-12-02 NOTE — Progress Notes (Signed)
Pt removed from [facility] and escorted to Ultrasound for BPP by J.Johnson. NT

## 2010-12-02 NOTE — Progress Notes (Signed)
Dr Arby Barrette notified of platelet count at 1400 and orders rec for epidural to be removed at this time.

## 2010-12-02 NOTE — Progress Notes (Signed)
Dr Arlyce Dice notified regarding pt stating she wears 2L O2 at night via nasal canula for decreased oxygen saturations. Ok for pt to use per MD. Parameters for BP received as well.

## 2010-12-02 NOTE — Progress Notes (Signed)
Pt returned to BP monitor and EFM.

## 2010-12-03 LAB — CBC
MCH: 33.1 pg (ref 26.0–34.0)
MCHC: 34 g/dL (ref 30.0–36.0)
MCV: 97.4 fL (ref 78.0–100.0)
Platelets: 139 10*3/uL — ABNORMAL LOW (ref 150–400)
RDW: 14.5 % (ref 11.5–15.5)

## 2010-12-03 MED ORDER — PRENATAL PLUS 27-1 MG PO TABS
1.0000 | ORAL_TABLET | Freq: Every day | ORAL | Status: DC
  Administered 2010-12-03 – 2010-12-04 (×2): 1 via ORAL
  Filled 2010-12-03 (×2): qty 1

## 2010-12-03 MED ORDER — NIFEDIPINE ER 30 MG PO TB24
30.0000 mg | ORAL_TABLET | Freq: Every day | ORAL | Status: DC
  Administered 2010-12-03 – 2010-12-04 (×2): 30 mg via ORAL
  Filled 2010-12-03 (×2): qty 1

## 2010-12-03 NOTE — Progress Notes (Signed)
BP elevated to 140/90 + range.  Otherwise stable.  Breast feed.  Does desire circ.  CBC today:  11.5/9.1 with 139 K platelets.  Will check labs AM again including CBC and CMP.  Will start Procardia 30 mg XL today.  Will do circ today.

## 2010-12-03 NOTE — Anesthesia Postprocedure Evaluation (Signed)
  Anesthesia Post-op Note  Patient: Julie Copeland  Procedure(s) Performed: * No procedures listed *  Patient Location: 123  Anesthesia Type: Epidural  Level of Consciousness: awake, alert  and oriented  Airway and Oxygen Therapy: Patient Spontanous Breathing  Post-op Pain: mild  Post-op Assessment: Post-op Vital signs reviewed, Patient's Cardiovascular Status Stable, Pain level controlled, No headache, No backache, No residual numbness and No residual motor weakness  Post-op Vital Signs: Reviewed and stable  Complications: No apparent anesthesia complications

## 2010-12-03 NOTE — Progress Notes (Signed)
Encounter addended by: Karleen Dolphin on: 12/03/2010  9:37 AM<BR>     Documentation filed: Notes Section, Charges VN

## 2010-12-04 ENCOUNTER — Other Ambulatory Visit: Payer: Self-pay | Admitting: Pulmonary Disease

## 2010-12-04 ENCOUNTER — Telehealth: Payer: Self-pay | Admitting: Pulmonary Disease

## 2010-12-04 DIAGNOSIS — G4733 Obstructive sleep apnea (adult) (pediatric): Secondary | ICD-10-CM

## 2010-12-04 LAB — COMPREHENSIVE METABOLIC PANEL
ALT: 16 U/L (ref 0–35)
AST: 22 U/L (ref 0–37)
Albumin: 2.4 g/dL — ABNORMAL LOW (ref 3.5–5.2)
Alkaline Phosphatase: 127 U/L — ABNORMAL HIGH (ref 39–117)
BUN: 6 mg/dL (ref 6–23)
Potassium: 3.7 mEq/L (ref 3.5–5.1)
Sodium: 135 mEq/L (ref 135–145)
Total Protein: 5.7 g/dL — ABNORMAL LOW (ref 6.0–8.3)

## 2010-12-04 LAB — CBC
MCHC: 33.3 g/dL (ref 30.0–36.0)
Platelets: 143 10*3/uL — ABNORMAL LOW (ref 150–400)
RDW: 14.5 % (ref 11.5–15.5)
WBC: 7.7 10*3/uL (ref 4.0–10.5)

## 2010-12-04 MED ORDER — NIFEDIPINE ER 30 MG PO TB24: 30.0000 mg | ORAL_TABLET | Freq: Every day | ORAL | Status: DC

## 2010-12-04 MED ORDER — IBUPROFEN 600 MG PO TABS: 600.0000 mg | ORAL_TABLET | Freq: Four times a day (QID) | ORAL | Status: AC

## 2010-12-04 MED ORDER — PRENATAL PLUS 27-1 MG PO TABS: 1.0000 | ORAL_TABLET | Freq: Every day | ORAL | Status: DC

## 2010-12-04 NOTE — Discharge Summary (Signed)
  34yo patient was admitted with high BP 12/01/10 and induction of labor was started.  Patient progressed well and delivered by NSVD a female infant 12/02/10.  Pt was started on Procardia 30 XL postpartum and BPs remained mild to normal range.  Pt is breast feeding and lochia is appropriate.  Pt will present to office early next week for BP check and has been advised to call with any of the symptoms reviewed with her associated with preeeclampsia.  Pt was given Rx for Procardia XL 30 and ibuprofen 600mg .  Circ care was reviewed.

## 2010-12-04 NOTE — Progress Notes (Signed)
UR chart review completed.  

## 2010-12-04 NOTE — Telephone Encounter (Signed)
Spoke with pt. She states has stopped using o2 since gave birth and now needs order sent to [facility] for them to pick up. She states that these were the instructions [facility] gave her. Please advise if okay to send order, thanks!

## 2010-12-04 NOTE — Telephone Encounter (Signed)
Ok to d/c oxygen since not enough desat on sleep study to justify O2 at night in nonpregnant state. Will send order to [facility].

## 2010-12-07 NOTE — Telephone Encounter (Signed)
Pt aware. Julie Copeland, CMA  

## 2012-07-06 ENCOUNTER — Emergency Department (HOSPITAL_COMMUNITY)
Admission: EM | Admit: 2012-07-06 | Discharge: 2012-07-06 | Disposition: A | Discharge status: Home / Self Care | Payer: BC Managed Care – PPO | Attending: Emergency Medicine | Admitting: Emergency Medicine

## 2012-07-06 ENCOUNTER — Encounter (HOSPITAL_COMMUNITY): Payer: Self-pay | Admitting: Emergency Medicine

## 2012-07-06 DIAGNOSIS — J45909 Unspecified asthma, uncomplicated: Secondary | ICD-10-CM | POA: Insufficient documentation

## 2012-07-06 DIAGNOSIS — Y9389 Activity, other specified: Secondary | ICD-10-CM | POA: Insufficient documentation

## 2012-07-06 DIAGNOSIS — Z8619 Personal history of other infectious and parasitic diseases: Secondary | ICD-10-CM | POA: Insufficient documentation

## 2012-07-06 DIAGNOSIS — T148XXA Other injury of unspecified body region, initial encounter: Secondary | ICD-10-CM | POA: Insufficient documentation

## 2012-07-06 DIAGNOSIS — Y92410 Unspecified street and highway as the place of occurrence of the external cause: Secondary | ICD-10-CM | POA: Insufficient documentation

## 2012-07-06 DIAGNOSIS — Z79899 Other long term (current) drug therapy: Secondary | ICD-10-CM | POA: Insufficient documentation

## 2012-07-06 DIAGNOSIS — Z8679 Personal history of other diseases of the circulatory system: Secondary | ICD-10-CM | POA: Insufficient documentation

## 2012-07-06 MED ORDER — NAPROXEN 375 MG PO TABS: 375.0000 mg | ORAL_TABLET | Freq: Two times a day (BID) | ORAL | Status: DC

## 2012-07-06 MED ORDER — CYCLOBENZAPRINE HCL 10 MG PO TABS: 10.0000 mg | ORAL_TABLET | Freq: Two times a day (BID) | ORAL | Status: DC | PRN
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
## 2012-07-06 NOTE — ED Notes (Signed)
Patient is alert and orientedx4.  Patient was explained discharge instructions and they understood them with no questions.   
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
## 2012-07-10 NOTE — ED Provider Notes (Signed)
Medical screening examination/treatment/procedure(s) were performed by non-physician practitioner and as supervising physician I was immediately available for consultation/collaboration.  Arihana Ambrocio, MD 07/10/12 1544 

## 2012-08-04 ENCOUNTER — Emergency Department (HOSPITAL_COMMUNITY)
Admission: EM | Admit: 2012-08-04 | Discharge: 2012-08-05 | Disposition: A | Discharge status: Home / Self Care | Payer: BC Managed Care – PPO | Attending: Emergency Medicine | Admitting: Emergency Medicine

## 2012-08-04 ENCOUNTER — Encounter (HOSPITAL_COMMUNITY): Payer: Self-pay | Admitting: Emergency Medicine

## 2012-08-04 DIAGNOSIS — Z8619 Personal history of other infectious and parasitic diseases: Secondary | ICD-10-CM | POA: Insufficient documentation

## 2012-08-04 DIAGNOSIS — Y9289 Other specified places as the place of occurrence of the external cause: Secondary | ICD-10-CM | POA: Insufficient documentation

## 2012-08-04 DIAGNOSIS — Y9389 Activity, other specified: Secondary | ICD-10-CM | POA: Insufficient documentation

## 2012-08-04 DIAGNOSIS — R21 Rash and other nonspecific skin eruption: Secondary | ICD-10-CM | POA: Insufficient documentation

## 2012-08-04 DIAGNOSIS — J45909 Unspecified asthma, uncomplicated: Secondary | ICD-10-CM | POA: Insufficient documentation

## 2012-08-04 NOTE — ED Notes (Signed)
C/o ? 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  C/o one of the bites to posterior L ankle now appears to be a blister.

## 2012-08-04 NOTE — ED Provider Notes (Signed)
History  This chart was scribed for Arnoldo Hooker, PA-C working with Brandt Loosen, MD by Ardelia Mems, ED Scribe. This patient was seen in room TR05C/TR05C and the patient's care was started at 11:32 PM.   CSN: [id number]  Arrival date & time 08/04/12  2219     Chief Complaint  Patient presents with  . Insect Bite     The history is provided by the patient. No language interpreter was used.    HPI Comments: Julie Copeland is a 37 y.o. 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 There is associated constant, moderate pain of the left foot. Pt states that one of the suspected bites to her posterior left ankle has now formed into a painful blister. Pt states that walking is very difficult and weight-bearing exacerbates her pain. Pt states that she is not sleeping well. Pt states that she has had similar "mosquito bites" appearing on her back and other body areas for at least a month. Pt states that she shares a bed with her children, who do not exhibit similar symptoms.   Past Medical History  Diagnosis Date  . Hypertension in pregnancy   . Childhood asthma   . No pertinent past medical history   . Chlamydia     Past Surgical History  Procedure Laterality Date  . Wisdom tooth extraction  1997  . Induced abortion  2000 and 2006  . No past surgeries      Family History  Problem Relation Age of Onset  . Heart disease Mother   . Heart disease Father   . Asthma Brother     History  Substance Use Topics  . Smoking status: Never Smoker   . Smokeless tobacco: Never Used  . Alcohol Use: Yes    OB History   Grav Para Term Preterm Abortions TAB SAB Ect Mult Living   7 3 3  0 3 2 1  0 0 3      Review of Systems  Constitutional: Negative for fever.  Musculoskeletal: Negative.   Skin:       See HPI.  Neurological: Negative for  numbness.    Allergies  Review of patient's allergies indicates no known allergies.  Home Medications   Current Outpatient Rx  Name  Route  Sig  Dispense  Refill  . cyclobenzaprine (FLEXERIL) 10 MG tablet   Oral   Take 1 tablet (10 mg total) by mouth 2 (two) times daily as needed for muscle spasms.   20 tablet   0   . naproxen (NAPROSYN) 375 MG tablet   Oral   Take 1 tablet (375 mg total) by mouth 2 (two) times daily.   20 tablet   0   . EXPIRED: NIFEdipine (PROCARDIA-XL/ADALAT CC) 30 MG 24 hr tablet   Oral   Take 1 tablet (30 mg total) by mouth daily.   30 tablet   1     Triage Vitals: BP 151/97  Pulse 104  Temp(Src) 98.3 F (36.8 C) (Oral)  Resp 16  SpO2 99%  LMP 07/06/2012  Physical Exam  Nursing note and vitals reviewed. Constitutional: She is oriented to person, place, and time. She appears well-developed and well-nourished. No distress.  HENT:  Head: Normocephalic and atraumatic.  Eyes: EOM are normal. Pupils are equal, round, and reactive to light.  Neck: Normal range of motion. No tracheal deviation  present.  Cardiovascular: Normal rate.   Pulmonary/Chest: Effort normal.  Abdominal: Soft. There is no tenderness.  Neurological: She is alert and oriented to person, place, and time.  Skin: Skin is warm.  Painful nodular lesions left ankle laterally with 2 cm in diameter fluid filled blister posterior ankle. Foot is moderately swollen without redness or warmth. FROM. There are several similar nodular lesions to back and shoulders in various stages of healing. No other blisters, no pustules or abscesses.   Psychiatric: She has a normal mood and affect.    ED Course  Procedures (including critical care time)  DIAGNOSTIC STUDIES: Oxygen Saturation is 99% on RA, normal by my interpretation.    COORDINATION OF CARE: 11:49 PM- Pt advised of plan for treatment and pt agrees.     Labs Reviewed - No data to display No results found.   No diagnosis  found.  1. Rash   MDM  Blister open by aspiration at request of patient, which provided some relief of pain. Dr. Preston Fleeting in to see patient.          I personally performed the services described in this documentation, which was scribed in my presence. The recorded information has been reviewed and is accurate.     Arnoldo Hooker, PA-C 08/05/12 [phone]

## 2012-08-05 MED ORDER — HYDROCODONE-ACETAMINOPHEN 5-325 MG PO TABS: 1.0000 | ORAL_TABLET | ORAL | Status: DC | PRN

## 2012-08-05 MED ORDER — PREDNISONE 20 MG PO TABS: 60.0000 mg | ORAL_TABLET | Freq: Every day | ORAL | Status: DC

## 2012-08-05 MED ORDER — SULFAMETHOXAZOLE-TRIMETHOPRIM 800-160 MG PO TABS: 1.0000 | ORAL_TABLET | Freq: Two times a day (BID) | ORAL | Status: DC

## 2012-08-05 NOTE — ED Provider Notes (Signed)
37 year old female has been having red spots come up on her legs and back. There is she had painful and eventually scarred down. Today, she noted a blister on the back of her left ankle. She said that occurred. Exam, there is a bulla over the left Achilles tendon is nonspecific in appearance. There is a mild swelling in the left foot and ankle without any discrete lesions seen. There is a lesion in her left upper back which is erythematous and somewhat indurated and has appearance of either early cellulitis or possible early abscess. Her scars from previous lesions in the back may be suspicious that she actually has been having abscesses which are spontaneously healing. She will be treated empirically for possible MRSA infection. The lesions in her leg seemed to be different and seemed to be inflammatory in nature and she'll also be given a short course of prednisone. She'll be referred to dermatology for followup  Medical screening examination/treatment/procedure(s) were conducted as a shared visit with non-physician practitioner(s) and myself.  I personally evaluated the patient during the encounter   Dione Booze, MD 08/05/12 0002

## 2012-08-05 NOTE — ED Notes (Signed)
Pt reports noticing a bump on left ankle. States bump started oozing pus and now has spread to left outer foot. rates pain 10/10.

## 2014-01-07 ENCOUNTER — Encounter (HOSPITAL_COMMUNITY): Payer: Self-pay | Admitting: Emergency Medicine

## 2014-09-18 ENCOUNTER — Inpatient Hospital Stay (HOSPITAL_COMMUNITY)
Admission: AD | Admit: 2014-09-18 | Discharge: 2014-09-18 | Disposition: A | Discharge status: Home / Self Care | Payer: BC Managed Care – PPO | Source: Ambulatory Visit | Attending: Obstetrics and Gynecology | Admitting: Obstetrics and Gynecology

## 2014-09-18 ENCOUNTER — Encounter (HOSPITAL_COMMUNITY): Payer: Self-pay | Admitting: *Deleted

## 2014-09-18 DIAGNOSIS — I10 Essential (primary) hypertension: Secondary | ICD-10-CM | POA: Diagnosis not present

## 2014-09-18 DIAGNOSIS — O99211 Obesity complicating pregnancy, first trimester: Secondary | ICD-10-CM | POA: Diagnosis not present

## 2014-09-18 DIAGNOSIS — Z3A09 9 weeks gestation of pregnancy: Secondary | ICD-10-CM | POA: Insufficient documentation

## 2014-09-18 DIAGNOSIS — E669 Obesity, unspecified: Secondary | ICD-10-CM | POA: Insufficient documentation

## 2014-09-18 DIAGNOSIS — R030 Elevated blood-pressure reading, without diagnosis of hypertension: Secondary | ICD-10-CM | POA: Diagnosis present

## 2014-09-18 DIAGNOSIS — O09521 Supervision of elderly multigravida, first trimester: Secondary | ICD-10-CM | POA: Diagnosis not present

## 2014-09-18 DIAGNOSIS — O10911 Unspecified pre-existing hypertension complicating pregnancy, first trimester: Secondary | ICD-10-CM | POA: Insufficient documentation

## 2014-09-18 LAB — URINE MICROSCOPIC-ADD ON

## 2014-09-18 LAB — URINALYSIS, ROUTINE W REFLEX MICROSCOPIC
Bilirubin Urine: NEGATIVE
GLUCOSE, UA: NEGATIVE mg/dL
Ketones, ur: NEGATIVE mg/dL
LEUKOCYTES UA: NEGATIVE
Nitrite: NEGATIVE
Protein, ur: NEGATIVE mg/dL
SPECIFIC GRAVITY, URINE: 1.025 (ref 1.005–1.030)
UROBILINOGEN UA: 0.2 mg/dL (ref 0.0–1.0)
pH: 5.5 (ref 5.0–8.0)

## 2014-09-18 LAB — POCT PREGNANCY, URINE: Preg Test, Ur: POSITIVE — AB

## 2014-09-18 NOTE — Discharge Instructions (Signed)

## 2014-09-18 NOTE — MAU Provider Note (Signed)
History     CSN: [id number]  Arrival date and time: 09/18/14 1341   First Provider Initiated Contact with Patient 09/18/14 1533      Chief Complaint  Patient presents with  . Hypertension   HPI Comments: Julie Copeland is a 39 y.o. [phone] at 4845w5d here for elevated blood pressure.. [facility] and had her blood pressure of 160/98. She then called her physician who advised coming to MAU. She denies history of hypertension except with her pregnancies, however she has had elevations documented in 2014 in ED.   Hypertension This is a recurrent problem. The current episode started today. The problem is unchanged. Pertinent negatives include no anxiety, blurred vision, chest pain, headaches, malaise/fatigue, neck pain, orthopnea or shortness of breath. There are no associated agents to hypertension. Risk factors for coronary artery disease include obesity. Past treatments include nothing (GHTN). There are no compliance problems.  There is no history of kidney disease or a thyroid problem. There is no history of chronic renal disease or sleep apnea.   Pregnancy course: Had NOB and US done in office.   OB History  Gravida Para Term Preterm AB SAB TAB Ectopic Multiple Living  8 3 3  0 3 1 2  0 0 3    # Outcome Date GA Lbr Len/2nd Weight Sex Delivery Anes PTL Lv  8 Current           7 Term 12/02/10 4488w0d / 00:09 2.55 kg (5 lb 10 oz) M Vag-Spont EPI  Y     Comments: wnl  6 TAB           5 TAB           4 SAB           3 Term           2 Term           1 Gravida                 Past Medical History  Diagnosis Date  . Hypertension in pregnancy   . Childhood asthma   . No pertinent past medical history   . Chlamydia     Past Surgical History  Procedure Laterality Date  . Wisdom tooth extraction  1997  . Induced abortion  2000 and 2006    Family History  Problem Relation Age of Onset  . Heart disease Mother   . Heart disease Father   . Asthma  Brother     History  Substance Use Topics  . Smoking status: Never Smoker   . Smokeless tobacco: Never Used  . Alcohol Use: Yes    Allergies:  Allergies  Allergen Reactions  . Other Hives and Itching    Variety of nuts  . Monistat [Miconazole] Itching, Swelling and Rash    Prescriptions prior to admission  Medication Sig Dispense Refill Last Dose  . clotrimazole (LOTRIMIN) 1 % cream Apply 1 application topically 2 (two) times daily.   Past Week at Unknown time  . HYDROcodone-acetaminophen (NORCO/VICODIN) 5-325 MG per tablet Take 1-2 tablets by mouth every 4 (four) hours as needed for pain. (Patient not taking: Reported on 09/18/2014) 15 tablet 0   . NIFEdipine (PROCARDIA-XL/ADALAT CC) 30 MG 24 hr tablet Take 1 tablet (30 mg total) by mouth daily. (Patient not taking: Reported on 09/18/2014) 30 tablet 1   . predniSONE (DELTASONE) 20 MG tablet Take 3 tablets (60 mg total) by mouth daily. (  Patient not taking: Reported on 09/18/2014) 12 tablet 0   . sulfamethoxazole-trimethoprim (SEPTRA DS) 800-160 MG per tablet Take 1 tablet by mouth every 12 (twelve) hours. (Patient not taking: Reported on 09/18/2014) 10 tablet 0     Review of Systems  Constitutional: Negative for malaise/fatigue.  Eyes: Negative for blurred vision.  Respiratory: Negative for shortness of breath.   Cardiovascular: Negative for chest pain and orthopnea.  Musculoskeletal: Negative for neck pain.  Neurological: Negative for headaches.   Physical Exam   Blood pressure 140/82, pulse 89, temperature 98.7 F (37.1 C), temperature source Oral, resp. rate 18, height  (1.702 m), weight 108.069 kg (238 lb 4 oz), last menstrual period 07/12/2014.  151/97 in triage  Filed Vitals:   09/18/14 1610 09/18/14 1611 09/18/14 1616 09/18/14 1621  BP: 141/80 146/79 146/65 135/75  Pulse: 82 85 88 87  Temp:      TempSrc:      Resp:      Height:      Weight:       Physical Exam  Nursing note and vitals  reviewed. Constitutional: She is oriented to person, place, and time. No distress.  Obese  HENT:  Head: Normocephalic.  Eyes: Conjunctivae are normal. Pupils are equal, round, and reactive to light.  Neck: Normal range of motion. Neck supple. No thyromegaly present.  Cardiovascular: Normal rate.   Respiratory: Effort normal.  GI: Soft.  Musculoskeletal: She exhibits no edema.  Neurological: She is alert and oriented to person, place, and time.  Skin: Skin is warm and dry. She is not diaphoretic.  Psychiatric: She has a normal mood and affect. Her behavior is normal.    MAU Course  Procedures Results for orders placed or performed during the hospital encounter of 09/18/14 (from the past 24 hour(s))  Urinalysis, Routine w reflex microscopic (not at [facility])     Status: Abnormal   Collection Time: 09/18/14  2:25 PM  Result Value Ref Range   Color, Urine YELLOW YELLOW   APPearance CLEAR CLEAR   Specific Gravity, Urine 1.025 1.005 - 1.030   pH 5.5 5.0 - 8.0   Glucose, UA NEGATIVE NEGATIVE mg/dL   Hgb urine dipstick TRACE (A) NEGATIVE   Bilirubin Urine NEGATIVE NEGATIVE   Ketones, ur NEGATIVE NEGATIVE mg/dL   Protein, ur NEGATIVE NEGATIVE mg/dL   Urobilinogen, UA 0.2 0.0 - 1.0 mg/dL   Nitrite NEGATIVE NEGATIVE   Leukocytes, UA NEGATIVE NEGATIVE  Urine microscopic-add on     Status: Abnormal   Collection Time: 09/18/14  2:25 PM  Result Value Ref Range   Squamous Epithelial / LPF RARE RARE   WBC, UA 0-2 <3 WBC/hpf   RBC / HPF 0-2 <3 RBC/hpf   Bacteria, UA MANY (A) RARE  Pregnancy, urine POC     Status: Abnormal   Collection Time: 09/18/14  3:23 PM  Result Value Ref Range   Preg Test, Ur POSITIVE (A) NEGATIVE   Consulted Dr. Claiborne Billings F/U BP in 1 wk Explained to she likely has chronic HTN and does not have gestational HTN Assessment and Plan  AMA Obesity [redacted]w[redacted]d Hypertension without severe features  Discharge home   Medication List    STOP taking these medications         clotrimazole 1 % cream  Commonly known as:  LOTRIMIN     HYDROcodone-acetaminophen 5-325 MG per tablet  Commonly known as:  NORCO/VICODIN     NIFEdipine 30 MG 24 hr tablet  Commonly known as:  PROCARDIA-XL/ADALAT CC     predniSONE 20 MG tablet  Commonly known as:  DELTASONE     sulfamethoxazole-trimethoprim 800-160 MG per tablet  Commonly known as:  SEPTRA DS       Follow-up Information    Follow up with CALLAHAN, SIDNEY, DO. Schedule an appointment as soon as possible for a visit in 1 week.   Specialty:  Obstetrics and Gynecology   Why:  For BP check and plan of care   Contact information:   [address] [place] [address] [phone]        Lateria Alderman 09/18/2014, 3:33 PM

## 2014-09-18 NOTE — MAU Note (Addendum)
Patient presents at [redacted] weeks gestation and sent by her physician with elevated blood pressure. Denies bleeding or discharge.

## 2014-11-08 ENCOUNTER — Ambulatory Visit (HOSPITAL_COMMUNITY): Payer: BC Managed Care – PPO

## 2014-11-08 ENCOUNTER — Other Ambulatory Visit (HOSPITAL_COMMUNITY): Payer: Self-pay | Admitting: Obstetrics

## 2014-11-08 DIAGNOSIS — Z3A18 18 weeks gestation of pregnancy: Secondary | ICD-10-CM

## 2014-11-08 DIAGNOSIS — O285 Abnormal chromosomal and genetic finding on antenatal screening of mother: Secondary | ICD-10-CM

## 2014-11-12 ENCOUNTER — Ambulatory Visit (HOSPITAL_COMMUNITY)
Admission: RE | Admit: 2014-11-12 | Discharge: 2014-11-12 | Disposition: A | Discharge status: Home / Self Care | Payer: BC Managed Care – PPO | Source: Ambulatory Visit | Attending: Obstetrics | Admitting: Obstetrics

## 2014-11-12 ENCOUNTER — Encounter (HOSPITAL_COMMUNITY): Payer: Self-pay

## 2014-11-12 ENCOUNTER — Other Ambulatory Visit (HOSPITAL_COMMUNITY): Payer: Self-pay | Admitting: Obstetrics

## 2014-11-12 DIAGNOSIS — O10919 Unspecified pre-existing hypertension complicating pregnancy, unspecified trimester: Secondary | ICD-10-CM

## 2014-11-12 DIAGNOSIS — O09522 Supervision of elderly multigravida, second trimester: Secondary | ICD-10-CM | POA: Diagnosis not present

## 2014-11-12 DIAGNOSIS — O365990 Maternal care for other known or suspected poor fetal growth, unspecified trimester, not applicable or unspecified: Secondary | ICD-10-CM | POA: Insufficient documentation

## 2014-11-12 DIAGNOSIS — O280 Abnormal hematological finding on antenatal screening of mother: Secondary | ICD-10-CM

## 2014-11-12 DIAGNOSIS — O285 Abnormal chromosomal and genetic finding on antenatal screening of mother: Secondary | ICD-10-CM

## 2014-11-12 DIAGNOSIS — Z3A17 17 weeks gestation of pregnancy: Secondary | ICD-10-CM | POA: Diagnosis not present

## 2014-11-12 DIAGNOSIS — Z315 Encounter for genetic counseling: Secondary | ICD-10-CM | POA: Diagnosis not present

## 2014-11-12 DIAGNOSIS — O10019 Pre-existing essential hypertension complicating pregnancy, unspecified trimester: Secondary | ICD-10-CM | POA: Insufficient documentation

## 2014-11-12 DIAGNOSIS — Z3689 Encounter for other specified antenatal screening: Secondary | ICD-10-CM

## 2014-11-12 DIAGNOSIS — O09899 Supervision of other high risk pregnancies, unspecified trimester: Secondary | ICD-10-CM | POA: Insufficient documentation

## 2014-11-12 DIAGNOSIS — O289 Unspecified abnormal findings on antenatal screening of mother: Secondary | ICD-10-CM

## 2014-11-12 DIAGNOSIS — IMO0001 Reserved for inherently not codable concepts without codable children: Secondary | ICD-10-CM

## 2014-11-12 DIAGNOSIS — Z36 Encounter for antenatal screening of mother: Secondary | ICD-10-CM | POA: Diagnosis not present

## 2014-11-12 HISTORY — DX: Abnormal hematological finding on antenatal screening of mother: O28.0

## 2014-11-12 NOTE — Progress Notes (Addendum)
Genetic Counseling  High-Risk Gestation Note  Appointment Date:  11/12/2014 Referred By: Marlow Baars, MD Date of Birth:  14-Feb-1976   Pregnancy History: [id number] Estimated Date of Delivery: 04/18/15 Estimated Gestational Age: 109w4d Attending: Rema Fendt, MD    [name] was seen for consultation for genetic counseling because of an elevated MSAFP of 4.86 MoMs based on maternal serum screening through [employer].   In Summary:   Elevated MSAFP 4.86 MoM  Detailed ultrasound today visualized fetal growth restriction, echogenic bowel, and single umbilical artery. Complete results under separate cover  Patient previously had normal NIPS (Panorama) through OB office for advanced maternal age  Reviewed possible etiologies for ultrasound findings including chromosome condition (aneuploidy, deletions/duplications), single gene, poor placentation  Patient declined amniocentesis  Given ultrasound findings, patient would like to proceed with termination of pregnancy. She plans to talk with her OB office and contact our office if additional assistance is needed.    We reviewed [name]'s maternal serum screening result, the elevation of MSAFP, and the associated 1 in 10 risk for a fetal open neural tube defect.   We reviewed ONTDs, the typical multifactorial etiology, and variable prognosis.  In addition, we discussed additional explanations for an elevated MSAFP including: normal variation, twins, feto-maternal bleeding, a gestational dating error, abdominal wall defects, kidney differences, oligohydramnios, and placental problems.  An unexplained elevation of MSAFP is associated with an increased risk for third trimester complications including: prematurity, low birth weight, and pre-eclampsia.    Detailed ultrasound performed today visualized fetal growth restriction, echogenic bowel, and single umbilical artery. Given the fetal size on ultrasound today,  complete fetal anatomic survey was not able to be performed. Complete ultrasound results from today reported under separate cover.  We discussed that the ultrasound revealed fetal measurements that are consistently small, consistent with fetal growth restriction, with measurements today approximately 2 weeks lag.  There is considerable variability in the definition of what constitutes fetal growth restriction (FGR); however, the most commonly accepted definition is fetal/birth weight below the 10th percentile for gestational age.  Approximately 3-10% of all pregnancies have FGR, and it can result from a variety of causes, including chronic uteroplacental insufficiency, environmental exposures (drugs, alcohol, and other teratogens), congenital infections, or genetic etiologies.  We discussed that the additional findings of echogenic bowel and single umbilical artery are not considered congenital anomalies but increase the chance for a specific underlying etiology.   Echogenic bowel refers to an area in the fetal intestines that appears brighter, or denser, than the liver and/or bone on ultrasound. Echogenic bowel is detected in approximately 1% of fetuses at the time of a second trimester ultrasound evaluation.  Most of the time, this brightness does not cause any problems and will spontaneously resolve.  Possible causes of an echogenic bowel include undergrowth of the bowel, an obstruction or blockage of the intestines, the fetus swallowing a small amount of blood present in the amniotic fluid, an intrauterine infection, a chromosome condition, cystic fibrosis, or a normal variation in development.  Single umbilical artery occurs in approximately 1 in every 200 pregnancies and is defined as the presence of one umbilical artery and one umbilical vein, instead of the typical three vessel cord containing two arteries and one vein.  We discussed that the finding of SUA has been associated with fetal aneuploidy, when  visualized in the presence of additional risk factors. The literature is conflicting regarding an association between this finding and congenital heart defects.  In addition, we discussed that SUA is known to be associated with an increased risk for fetal growth restriction.     A detailed medical history was obtained; [name] denies use of any illicit substances. She reported drinking an average of two glasses of alcohol per weekend until approximately [redacted] weeks gestation, at which time she became aware of the pregnancy.  Prenatal alcohol exposure can increase the risk for growth delays, small head size, heart defects, eye and facial differences, as well as behavior problems and learning disabilities. The risk of these to occur tends to increase with the amount of alcohol consumed. However, because there is no identified safe amount of alcohol in pregnancy, it is recommended to completely avoid alcohol in pregnancy. Given the reported amount of exposure, we discussed that the findings from today's ultrasound have a low likelihood to be related to this early alcohol exposure.  [name] does not have a history of specific maternal illnesses and denies known exposure to infections.    We discussed that uteroplacental insufficiency or poor placentation is a common cause of fetal growth restriction.  We reviewed that the finding of elevated MSAFP increases the concern for a placental etiology, but does not necessarily confirm this as the underlying cause in the pregnancy.   Regarding genetic etiologies, we discussed the increased risk for specific chromosome conditions including fetal aneuploidy.  We reviewed chromosomes, nondisjunction, and the common features of conditions such as trisomy 40 and triploidy. [name] previously had noninvasive prenatal screening (NIPS) through her OB office. This screen (Panorama through [employer]) indicated a low risk for trisomies 21, 55, 13, as well as low  risk for monosomy X and triploidy. We reviewed that this screen is highly sensitive and specific but is not diagnostic for these conditions.  In addition, we discussed the slight increase in risk for other chromosome aberrations including less common trisomies, uniparental disomy, microdeletions, duplications, insertions, and translocations.      Ms. Polinsky was then counseled regarding the availability of amniocentesis including the associated risks, benefits, and limitations.  She understands that chromosome analysis can be performed both prenatally (amniotic fluid) and postnatally (peripheral blood, cord blood, or products of conception).  Additionally, we discussed the availability of microarray analysis, which can also be performed pre and postnatally via the same samples as chromosome analysis.  She was counseled that microarray analysis is a molecular based technique in which a test sample of DNA (fetal) is compared to a reference (normal) genome in order to determine if the test sample has any extra or missing genetic information.  Microarray analysis allows for the detection of genetic deletions and duplications that are 100 times smaller than those identified by routine chromosome analysis.  We discussed that recent publications show that approximately 6% of patients with an abnormal fetal ultrasound and a normal fetal karyotype had a significant microdeletion/microduplication detected by prenatal microarray analysis.  We reviewed benefits and limitations of microarray analysis including that copy number variants of uncertain significance or pertaining to unrelated medical issues may be discovered through this method.  After thoughtful consideration of her options, Ms. Wiggs declined amniocentesis.    We then discussed other possible explanations for the above discussed ultrasound findings including single gene conditions. Single gene conditions are typically tested for postnatally, based on the  recommendation of a medical geneticist, unless ultrasound findings or the family history are strongly suggestive of a specific syndrome. We discussed that amniocentesis does not typically assess for single gene  conditions unless ultrasound or family history are strongly suggestive of a particular syndrome for which genetic testing is clinically available.  We discussed that the prognosis and postnatal management depend on the underlying etiology of the FGR. However, we discussed that in general, earlier onset of fetal growth restriction in pregnancy are more likely to be associated with a poor prognosis.   After careful consideration, [name] stated that she would like to pursue termination of pregnancy given the chance for associated medical issues due to early fetal growth restriction. She planned to further discuss this option with her OB office. The patient understands that she can contact our office if additional assistance is needed. She understands that recurrence risk cannot be accurately determined for a future pregnancy in the absence of an identified etiology.   [name] was provided with written information regarding sickle cell anemia (SCA) including the carrier frequency and incidence in the African American population, the availability of carrier testing and prenatal diagnosis if indicated.  In addition, we discussed that hemoglobinopathies are routinely screened for as part of the [facility] newborn screening panel.  She declined additional discussion regarding this information today.    Both family histories were reviewed and found to be contributory for the couple's oldest child, a son, with autism. He is currently 10 years old, and an underlying cause is not known for his autism. We discussed that autism is part of the spectrum of conditions referred to as Autistic spectrum disorders (ASD). We discussed that ASDs are among the most common neurodevelopmental disorders, with  approximately 1 in 68 children meeting criteria for ASD, according to the Centers for Disease Control. Approximately 80% of individuals diagnosed are female. There is strong evidence that genetic factors play a critical role in development of ASD. There have been recent advances in identifying specific genetic causes of ASD, however, there are still many individuals for whom the etiology of the ASD is not known. The majority of individuals with ASD (70-80%) have essential autism. There is strong evidence that genetic factors play a critical role in development of ASD. Some individuals with ASDs are found to have causative differences in karyotype analysis, chromosomal microarray analysis, or single genes. These are more likely to be identified in individuals with complex autism spectrum disorders.    Once a family has a child with a diagnosis of ASD, there is an approximate 13.5% chance to have another child with ASD. If the pregnancy is female the chance is approximately 9%, and approximately 26% if the pregnancy is female. She understands that at this time there is not genetic testing available for ASD for most families. In the case of an identified genetic cause, recurrence risk estimate may change. The patient is aware that for many individuals with autism spectrum disorders an underlying genetic cause is not identified at this time. In the absence of an identified genetic etiology, prenatal screening or testing would not be available in the current pregnancy for the autism spectrum disorders in the family.   Additionally, the father of the pregnancy has a nephew who was born with one hand not completely formed. He is otherwise healthy. An underlying cause was not known by the patient at the time of today's visit. We discussed that limb reduction defects can have a variety of causes including teratogenic, sporadic (such as amniotic band syndrome), or genetic. Given the reported family history, recurrence risk is  likely low for additional relatives. Additional information may alter risk  assessment for relatives. Without further information regarding the provided family history, an accurate genetic risk cannot be calculated. Further genetic counseling is warranted if more information is obtained.  I counseled [name] for approximately 40 minutes regarding the above risks and available options.    Quinn Plowman, MS,  Certified Genetic Counselor 11/12/2014

## 2014-11-14 ENCOUNTER — Encounter (HOSPITAL_COMMUNITY): Payer: Self-pay

## 2014-11-15 ENCOUNTER — Other Ambulatory Visit: Payer: Self-pay | Admitting: Obstetrics and Gynecology

## 2014-11-18 ENCOUNTER — Other Ambulatory Visit (HOSPITAL_COMMUNITY): Payer: Self-pay | Admitting: Obstetrics

## 2014-11-20 ENCOUNTER — Encounter (HOSPITAL_COMMUNITY)
Admission: AD | Disposition: A | Discharge status: Home / Self Care | Payer: Self-pay | Source: Ambulatory Visit | Attending: Obstetrics and Gynecology

## 2014-11-20 ENCOUNTER — Ambulatory Visit (HOSPITAL_COMMUNITY): Payer: BC Managed Care – PPO

## 2014-11-20 ENCOUNTER — Ambulatory Visit (HOSPITAL_COMMUNITY): Payer: BC Managed Care – PPO | Admitting: Anesthesiology

## 2014-11-20 ENCOUNTER — Ambulatory Visit (HOSPITAL_COMMUNITY)
Admission: AD | Admit: 2014-11-20 | Discharge: 2014-11-20 | Disposition: A | Discharge status: Home / Self Care | Payer: BC Managed Care – PPO | Source: Ambulatory Visit | Attending: Obstetrics and Gynecology | Admitting: Obstetrics and Gynecology

## 2014-11-20 ENCOUNTER — Encounter (HOSPITAL_COMMUNITY): Payer: Self-pay | Admitting: *Deleted

## 2014-11-20 DIAGNOSIS — Z332 Encounter for elective termination of pregnancy: Secondary | ICD-10-CM | POA: Insufficient documentation

## 2014-11-20 DIAGNOSIS — O358XX0 Maternal care for other (suspected) fetal abnormality and damage, not applicable or unspecified: Secondary | ICD-10-CM | POA: Diagnosis not present

## 2014-11-20 HISTORY — PX: DILATION AND EVACUATION: SHX1459

## 2014-11-20 HISTORY — DX: Sleep apnea, unspecified: G47.30

## 2014-11-20 LAB — CBC
HCT: 40.5 % (ref 36.0–46.0)
Hemoglobin: 14.1 g/dL (ref 12.0–15.0)
MCH: 32.6 pg (ref 26.0–34.0)
MCHC: 34.8 g/dL (ref 30.0–36.0)
MCV: 93.8 fL (ref 78.0–100.0)
PLATELETS: 172 10*3/uL (ref 150–400)
RBC: 4.32 MIL/uL (ref 3.87–5.11)
RDW: 13.5 % (ref 11.5–15.5)
WBC: 7.9 10*3/uL (ref 4.0–10.5)

## 2014-11-20 SURGERY — DILATION AND EVACUATION, UTERUS, SECOND TRIMESTER
Anesthesia: Choice

## 2014-11-20 MED ORDER — DEXAMETHASONE SODIUM PHOSPHATE 4 MG/ML IJ SOLN
INTRAMUSCULAR | Status: AC
  Filled 2014-11-20: qty 1

## 2014-11-20 MED ORDER — LIDOCAINE HCL (CARDIAC) 20 MG/ML IV SOLN
INTRAVENOUS | Status: AC
  Filled 2014-11-20: qty 5

## 2014-11-20 MED ORDER — PROPOFOL 10 MG/ML IV BOLUS
INTRAVENOUS | Status: DC | PRN
  Administered 2014-11-20: 200 mg via INTRAVENOUS

## 2014-11-20 MED ORDER — LACTATED RINGERS IV SOLN
INTRAVENOUS | Status: DC
  Administered 2014-11-20 (×2): via INTRAVENOUS

## 2014-11-20 MED ORDER — FENTANYL CITRATE (PF) 100 MCG/2ML IJ SOLN: 25.0000 ug | INTRAMUSCULAR | Status: DC | PRN

## 2014-11-20 MED ORDER — CEFAZOLIN SODIUM-DEXTROSE 2-3 GM-% IV SOLR
INTRAVENOUS | Status: AC
  Filled 2014-11-20: qty 50

## 2014-11-20 MED ORDER — SCOPOLAMINE 1 MG/3DAYS TD PT72
1.0000 | MEDICATED_PATCH | Freq: Once | TRANSDERMAL | Status: DC
  Administered 2014-11-20: 1.5 mg via TRANSDERMAL

## 2014-11-20 MED ORDER — ACETAMINOPHEN 160 MG/5ML PO SOLN: 975.0000 mg | Freq: Once | ORAL | Status: DC

## 2014-11-20 MED ORDER — PROPOFOL 10 MG/ML IV BOLUS
INTRAVENOUS | Status: AC
  Filled 2014-11-20: qty 20

## 2014-11-20 MED ORDER — LIDOCAINE HCL (CARDIAC) 20 MG/ML IV SOLN
INTRAVENOUS | Status: DC | PRN
  Administered 2014-11-20: 30 mg via INTRAVENOUS

## 2014-11-20 MED ORDER — MIDAZOLAM HCL 2 MG/2ML IJ SOLN
INTRAMUSCULAR | Status: AC
  Filled 2014-11-20: qty 4

## 2014-11-20 MED ORDER — METHYLERGONOVINE MALEATE 0.2 MG/ML IJ SOLN
INTRAMUSCULAR | Status: DC | PRN
  Administered 2014-11-20: 0.2 mg via INTRAMUSCULAR

## 2014-11-20 MED ORDER — CEFAZOLIN SODIUM-DEXTROSE 2-3 GM-% IV SOLR
2.0000 g | INTRAVENOUS | Status: AC
  Administered 2014-11-20: 2 g via INTRAVENOUS

## 2014-11-20 MED ORDER — DEXAMETHASONE SODIUM PHOSPHATE 10 MG/ML IJ SOLN
INTRAMUSCULAR | Status: DC | PRN
  Administered 2014-11-20: 4 mg via INTRAVENOUS

## 2014-11-20 MED ORDER — LIDOCAINE HCL 1 % IJ SOLN
INTRAMUSCULAR | Status: DC | PRN
  Administered 2014-11-20: 20 mL

## 2014-11-20 MED ORDER — ONDANSETRON HCL 4 MG/2ML IJ SOLN
INTRAMUSCULAR | Status: AC
  Filled 2014-11-20: qty 2

## 2014-11-20 MED ORDER — MIDAZOLAM HCL 2 MG/2ML IJ SOLN
INTRAMUSCULAR | Status: DC | PRN
  Administered 2014-11-20: 2 mg via INTRAVENOUS

## 2014-11-20 MED ORDER — LIDOCAINE HCL 1 % IJ SOLN
INTRAMUSCULAR | Status: AC
  Filled 2014-11-20: qty 20

## 2014-11-20 MED ORDER — METHYLERGONOVINE MALEATE 0.2 MG PO TABS: 0.2000 mg | ORAL_TABLET | Freq: Four times a day (QID) | ORAL | Status: DC

## 2014-11-20 MED ORDER — ONDANSETRON HCL 4 MG/2ML IJ SOLN
INTRAMUSCULAR | Status: DC | PRN
  Administered 2014-11-20: 4 mg via INTRAVENOUS

## 2014-11-20 MED ORDER — FENTANYL CITRATE (PF) 100 MCG/2ML IJ SOLN
INTRAMUSCULAR | Status: DC | PRN
  Administered 2014-11-20 (×2): 100 ug via INTRAVENOUS

## 2014-11-20 MED ORDER — FENTANYL CITRATE (PF) 250 MCG/5ML IJ SOLN
INTRAMUSCULAR | Status: AC
  Filled 2014-11-20: qty 25

## 2014-11-20 MED ORDER — SCOPOLAMINE 1 MG/3DAYS TD PT72
MEDICATED_PATCH | TRANSDERMAL | Status: AC
  Administered 2014-11-20: 1.5 mg via TRANSDERMAL
  Filled 2014-11-20: qty 1

## 2014-11-20 MED ORDER — METHYLERGONOVINE MALEATE 0.2 MG/ML IJ SOLN
INTRAMUSCULAR | Status: AC
  Filled 2014-11-20: qty 1

## 2014-11-20 MED ORDER — HYDROCODONE-IBUPROFEN 5-200 MG PO TABS: 1.0000 | ORAL_TABLET | Freq: Three times a day (TID) | ORAL | Status: DC | PRN

## 2014-11-20 SURGICAL SUPPLY — 15 items
ADAPTER VACURETTE TBG SET 14 (CANNULA) ×3 IMPLANT
CLOTH BEACON ORANGE TIMEOUT ST (SAFETY) ×3 IMPLANT
CONT PATH 16OZ SNAP LID 3702 (MISCELLANEOUS) ×3 IMPLANT
GLOVE ECLIPSE 7.0 STRL STRAW (GLOVE) ×6 IMPLANT
GOWN STRL REUS W/TWL LRG LVL3 (GOWN DISPOSABLE) ×9 IMPLANT
KIT BERKELEY 2ND TRIMESTER 1/2 (COLLECTOR) ×3 IMPLANT
NS IRRIG 1000ML POUR BTL (IV SOLUTION) ×3 IMPLANT
PACK VAGINAL MINOR WOMEN LF (CUSTOM PROCEDURE TRAY) ×3 IMPLANT
PAD OB MATERNITY 4.3X12.25 (PERSONAL CARE ITEMS) ×3 IMPLANT
PAD PREP 24X48 CUFFED NSTRL (MISCELLANEOUS) ×3 IMPLANT
TOWEL OR 17X24 6PK STRL BLUE (TOWEL DISPOSABLE) ×6 IMPLANT
TUBE VACURETTE 2ND TRIMESTER (CANNULA) ×3 IMPLANT
VACURETTE 12 RIGID CVD (CANNULA) IMPLANT
VACURETTE 14MM CVD 1/2 BASE (CANNULA) ×2 IMPLANT
VACURETTE 16MM ASPIR CVD .5 (CANNULA) IMPLANT

## 2014-11-20 NOTE — H&P (Signed)
Julie Copeland is an 39 y.o. [phone] [redacted]w[redacted]d black female who presents to the OR for a D&E for multiple fetal anomolies and IUGR. She did have a nl NIPT and declined an amnio. Pt had eval with MFM>  Chief Complaint: HPI:  Past Medical History  Diagnosis Date  . Hypertension in pregnancy   . Childhood asthma   . No pertinent past medical history   . Chlamydia   . Sleep apnea     with pregnancy  . Vaginal delivery 2002, 2007, 2012    Past Surgical History  Procedure Laterality Date  . Wisdom tooth extraction  1997  . Induced abortion  2000 and 2006    Family History  Problem Relation Age of Onset  . Heart disease Mother   . Heart disease Father   . Asthma Brother    Social History:  reports that she has never smoked. She has never used smokeless tobacco. She reports that she drinks alcohol. She reports that she does not use illicit drugs.  Allergies:  Allergies  Allergen Reactions  . Other Hives, Itching and Other (See Comments)    Pt states that she is allergic to a variety of nuts.    . Terconazole Itching, Swelling and Rash    Medications Prior to Admission  Medication Sig Dispense Refill  . Prenatal Vit-Fe Fumarate-FA (PRENATAL MULTIVITAMIN) TABS tablet Take 1 tablet by mouth daily.          Blood pressure 156/99, pulse 86, temperature 98.6 F (37 C), temperature source Oral, resp. rate 18, last menstrual period 07/22/2014, SpO2 98 %. General appearance: alert and cooperative Lungs: clear to auscultation bilaterally Heart: regular rate and rhythm, S1, S2 normal, no murmur, click, rub or gallop Abdomen: soft, non-tender; bowel sounds normal; no masses,  no organomegaly, gravid   Lab Results  Component Value Date   WBC 7.9 11/20/2014   HGB 14.1 11/20/2014   HCT 40.5 11/20/2014   MCV 93.8 11/20/2014   PLT 172 11/20/2014   Lab Results  Component Value Date   PREGTESTUR POSITIVE* 09/18/2014       Patient Active Problem List   Diagnosis Date Noted  .  Abnormal MSAFP (maternal serum alpha-fetoprotein), elevated 11/12/2014  . Pregnancy affected by fetal growth restriction 11/12/2014  . Single umbilical artery, maternal, antepartum 11/12/2014  . Nocturnal hypoxemia due to emphysema 11/05/2010  . OSA (obstructive sleep apnea) 10/09/2010   IMP/ IUP at 18 weeks with multiple fetal anomolies and IUGR Plan/ D&E  Julie Copeland E 11/20/2014, 12:03 PM

## 2014-11-20 NOTE — Anesthesia Postprocedure Evaluation (Signed)
  Anesthesia Post-op Note  Patient: Julie Copeland  Procedure(s) Performed: Procedure(s) (LRB): DILATATION AND EVACUATION (D&E) 2ND TRIMESTER (N/A)  Patient Location: PACU  Anesthesia Type: General  Level of Consciousness: awake and alert   Airway and Oxygen Therapy: Patient Spontanous Breathing  Post-op Pain: mild  Post-op Assessment: Post-op Vital signs reviewed, Patient's Cardiovascular Status Stable, Respiratory Function Stable, Patent Airway and No signs of Nausea or vomiting  Last Vitals:  Filed Vitals:   11/20/14 1300  BP: 130/71  Pulse: 81  Temp: 36.8 C  Resp:     Post-op Vital Signs: stable   Complications: No apparent anesthesia complications

## 2014-11-20 NOTE — Anesthesia Preprocedure Evaluation (Addendum)
Anesthesia Evaluation  Patient identified by MRN, date of birth, ID band Patient awake    Reviewed: Allergy & Precautions, H&P , Patient's Chart, lab work & pertinent test results, reviewed documented beta blocker date and time   Airway Mallampati: II  TM Distance: >3 FB Neck ROM: full    Dental no notable dental hx.    Pulmonary asthma , sleep apnea ,    Pulmonary exam normal breath sounds clear to auscultation       Cardiovascular hypertension,  Rhythm:regular Rate:Normal     Neuro/Psych    GI/Hepatic   Endo/Other    Renal/GU      Musculoskeletal   Abdominal   Peds  Hematology   Anesthesia Other Findings Hypertension in pregnancy   Childhood asthma     Sleep apnea with pregnancy Offered [facility]- Pt prefers not        Reproductive/Obstetrics                            Anesthesia Physical Anesthesia Plan  ASA: III  Anesthesia Plan:    Post-op Pain Management:    Induction: Intravenous  Airway Management Planned: LMA  Additional Equipment:   Intra-op Plan:   Post-operative Plan:   Informed Consent: I have reviewed the patients History and Physical, chart, labs and discussed the procedure including the risks, benefits and alternatives for the proposed anesthesia with the patient or authorized representative who has indicated his/her understanding and acceptance.   Dental Advisory Given and Dental advisory given  Plan Discussed with: CRNA and Surgeon  Anesthesia Plan Comments: (Discussed GA with LMA, possible sore throat, potential need to switch to ETT, N/V, pulmonary aspiration. Questions answered. )        Anesthesia Quick Evaluation

## 2014-11-20 NOTE — Transfer of Care (Signed)
Immediate Anesthesia Transfer of Care Note  Patient: Julie Copeland  Procedure(s) Performed: Procedure(s): DILATATION AND EVACUATION (D&E) 2ND TRIMESTER (N/A)  Patient Location: PACU  Anesthesia Type:General  Level of Consciousness: sedated and patient cooperative  Airway & Oxygen Therapy: Patient connected to nasal cannula oxygen  Post-op Assessment: Report given to RN and Post -op Vital signs reviewed and stable  Post vital signs: Reviewed and stable  Last Vitals:  Filed Vitals:   11/20/14 1105  BP: 156/99  Pulse: 86  Temp: 37 C  Resp: 18    Complications: No apparent anesthesia complications

## 2014-11-21 ENCOUNTER — Encounter (HOSPITAL_COMMUNITY): Payer: Self-pay | Admitting: Obstetrics and Gynecology

## 2014-11-21 NOTE — Op Note (Signed)
Julie Copeland, Julie Copeland             ACCOUNT NO.:  [id number]  MEDICAL RECORD NO.:  [id number]  LOCATION:  [facility]                          FACILITY:  [facility]  PHYSICIAN:  Malva Limes, M.D.    DATE OF BIRTH:  05-30-1975  DATE OF PROCEDURE:  11/20/2014 DATE OF DISCHARGE:  11/20/2014                              OPERATIVE REPORT   PREOPERATIVE DIAGNOSES: 1. Intrauterine pregnancy at 17-1/2 weeks' estimated gestational age. 2. Multiple fetal anomalies. 3. Intrauterine growth restriction.  POSTOPERATIVE DIAGNOSES: 1. Intrauterine pregnancy at 17-1/2 weeks' estimated gestational age. 2. Multiple fetal anomalies. 3. Intrauterine growth restriction.  PROCEDURE:  Dilation and evacuation in second trimester.  SURGEON:  Malva Limes, M.D.  ANESTHESIA:  General and local.  ANTIBIOTICS:  Ancef 2 g.  DRAINS:  Red rubber catheter in the bladder.  SPECIMENS:  Products of conception sent to Pathology.  COMPLICATIONS:  None.  ESTIMATED BLOOD LOSS:  100 mL.  DESCRIPTION OF PROCEDURE:  The patient was taken to the operating room where she was placed in a dorsal supine position.  A general anesthetic was administered without difficulty.  She was then placed in dorsal lithotomy position.  She was prepped and draped in usual fashion for this procedure.  Two laminaria which had previously been placed were removed along with 4x4.  Following this, a sterile speculum was placed in the vagina.  20 mL of 1% lidocaine was used for paracervical block. A single-tooth tenaculum was applied to the anterior cervical lip.  The cervix was serially dilated.  A 14 mm suction curette was then placed into the uterine cavity.  Products of conception withdrawn.  Ovoid forceps were used to remove the remaining pieces followed by repeat suction.  Sharp curettage was performed followed by repeat suction.  The patient was given Methergine 0.2 mg IM in the OR.  She will be discharged home with Methergine 0.2 mg p.o.  q.6 hours x6 and Vicodin.  Her blood type is Rh positive and therefore no RhoGAM is indicated.          ______________________________ Malva Limes, M.D.     MA/MEDQ  D:  11/20/2014  T:  11/21/2014  Job:  [id number]

## 2015-07-24 ENCOUNTER — Encounter (HOSPITAL_COMMUNITY): Payer: Self-pay | Admitting: *Deleted

## 2016-02-06 ENCOUNTER — Other Ambulatory Visit: Payer: Self-pay | Admitting: Obstetrics and Gynecology

## 2016-02-06 DIAGNOSIS — Z872 Personal history of diseases of the skin and subcutaneous tissue: Secondary | ICD-10-CM

## 2016-02-12 ENCOUNTER — Ambulatory Visit
Admission: RE | Admit: 2016-02-12 | Discharge: 2016-02-12 | Disposition: A | Discharge status: Home / Self Care | Payer: BC Managed Care – PPO | Source: Ambulatory Visit | Attending: Obstetrics and Gynecology | Admitting: Obstetrics and Gynecology

## 2016-02-12 ENCOUNTER — Other Ambulatory Visit: Payer: Self-pay | Admitting: Obstetrics and Gynecology

## 2016-02-12 DIAGNOSIS — Z872 Personal history of diseases of the skin and subcutaneous tissue: Secondary | ICD-10-CM

## 2016-02-18 ENCOUNTER — Other Ambulatory Visit: Payer: Self-pay | Admitting: Obstetrics and Gynecology

## 2016-02-19 LAB — CYTOLOGY - PAP

## 2017-01-06 ENCOUNTER — Institutional Professional Consult (permissible substitution): Payer: BC Managed Care – PPO | Admitting: Pulmonary Disease

## 2019-01-17 ENCOUNTER — Other Ambulatory Visit: Payer: Self-pay

## 2019-01-17 ENCOUNTER — Ambulatory Visit
Admission: EM | Admit: 2019-01-17 | Discharge: 2019-01-17 | Discharge status: Absent without leave | Payer: BC Managed Care – PPO

## 2019-01-17 DIAGNOSIS — Z5321 Procedure and treatment not carried out due to patient leaving prior to being seen by health care provider: Secondary | ICD-10-CM

## 2019-02-09 ENCOUNTER — Other Ambulatory Visit: Payer: Self-pay

## 2019-02-09 DIAGNOSIS — Z20822 Contact with and (suspected) exposure to covid-19: Secondary | ICD-10-CM

## 2019-02-12 LAB — NOVEL CORONAVIRUS, NAA: SARS-CoV-2, NAA: NOT DETECTED

## 2019-03-05 ENCOUNTER — Other Ambulatory Visit: Payer: Self-pay

## 2019-03-05 DIAGNOSIS — Z20822 Contact with and (suspected) exposure to covid-19: Secondary | ICD-10-CM

## 2019-03-06 LAB — NOVEL CORONAVIRUS, NAA: SARS-CoV-2, NAA: DETECTED — AB

## 2019-03-07 ENCOUNTER — Telehealth: Payer: Self-pay | Admitting: Nurse Practitioner

## 2019-03-07 NOTE — Telephone Encounter (Signed)
Called to Discuss with patient about Covid symptoms and the use of bamlanivimab, a monoclonal antibody infusion for those with mild to moderate Covid symptoms and at a high risk of hospitalization.     Patient may qualify due to weight/BMI.   Unable to reach pt

## 2019-03-07 NOTE — Telephone Encounter (Signed)
Called to Discuss with patient about Covid symptoms and the use of bamlanivimab, a monoclonal antibody infusion for those with mild to moderate Covid symptoms and at a high risk of hospitalization.     Pt is qualified for this infusion at the [facility] infusion center due to co-morbid conditions and/or a member of an at-risk group.     Patient Active Problem List   Diagnosis Date Noted  . Abnormal MSAFP (maternal serum alpha-fetoprotein), elevated 11/12/2014  . Pregnancy affected by fetal growth restriction 11/12/2014  . Single umbilical artery, maternal, antepartum 11/12/2014  . Nocturnal hypoxemia due to emphysema ([place]) 11/05/2010  . OSA (obstructive sleep apnea) 10/09/2010    Patient would like to think about it and call us back. Symptoms tier reviewed as well as criteria for ending isolation. Preventative practices reviewed. Patient verbalized understanding.    Patient advised to call back if he decides that he does want to get infusion. Callback number to the infusion center given. Patient advised to go to Urgent care or ED with severe symptoms.

## 2019-07-13 ENCOUNTER — Other Ambulatory Visit: Payer: Self-pay | Admitting: Obstetrics and Gynecology

## 2019-07-13 DIAGNOSIS — N63 Unspecified lump in unspecified breast: Secondary | ICD-10-CM

## 2019-07-17 ENCOUNTER — Ambulatory Visit
Admission: RE | Admit: 2019-07-17 | Discharge: 2019-07-17 | Disposition: A | Discharge status: Home / Self Care | Payer: BC Managed Care – PPO | Source: Ambulatory Visit | Attending: Obstetrics and Gynecology | Admitting: Obstetrics and Gynecology

## 2019-07-17 ENCOUNTER — Other Ambulatory Visit: Payer: Self-pay

## 2019-07-17 DIAGNOSIS — N63 Unspecified lump in unspecified breast: Secondary | ICD-10-CM

## 2019-12-28 ENCOUNTER — Encounter (HOSPITAL_COMMUNITY): Payer: Self-pay | Admitting: Emergency Medicine

## 2019-12-28 ENCOUNTER — Other Ambulatory Visit: Payer: Self-pay

## 2019-12-28 ENCOUNTER — Emergency Department (HOSPITAL_COMMUNITY)
Admission: EM | Admit: 2019-12-28 | Discharge: 2019-12-28 | Disposition: A | Discharge status: Home / Self Care | Payer: BC Managed Care – PPO | Attending: Emergency Medicine | Admitting: Emergency Medicine

## 2019-12-28 DIAGNOSIS — J45909 Unspecified asthma, uncomplicated: Secondary | ICD-10-CM | POA: Diagnosis not present

## 2019-12-28 DIAGNOSIS — J029 Acute pharyngitis, unspecified: Secondary | ICD-10-CM | POA: Insufficient documentation

## 2019-12-28 DIAGNOSIS — J351 Hypertrophy of tonsils: Secondary | ICD-10-CM | POA: Diagnosis present

## 2019-12-28 LAB — GROUP A STREP BY PCR: Group A Strep by PCR: NOT DETECTED

## 2019-12-28 MED ORDER — DEXAMETHASONE 4 MG PO TABS
10.0000 mg | ORAL_TABLET | Freq: Once | ORAL | Status: AC
  Administered 2019-12-28: 10 mg via ORAL
  Filled 2019-12-28: qty 3

## 2019-12-28 NOTE — ED Provider Notes (Signed)
[facility] EMERGENCY DEPARTMENT Provider Note   CSN: [id number] Arrival date & time: 12/28/19  2025     History Chief Complaint  Patient presents with  . swollen tonsils    Julie Copeland is a 44 y.o. female.  44 yo F with a chief complaints of painful swallowing and feeling like her tonsils are swollen.  This been going on for about 3 days now.  No obvious fevers at home no cough or congestion.  No sick contacts.  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  She denies any shortness of breath.  Attempted to go to urgent care but was unable to be seen.  The history is provided by the patient.  Illness Severity:  Moderate Onset quality:  Gradual Duration:  3 days Timing:  Constant Progression:  Worsening Chronicity:  New Associated symptoms: sore throat   Associated symptoms: no chest pain, no congestion, no fever, no headaches, no myalgias, no nausea, no rhinorrhea, no shortness of breath, no vomiting and no wheezing        Past Medical History:  Diagnosis Date  . Childhood asthma   . Chlamydia   . Hypertension in pregnancy   . No pertinent past medical history   . Sleep apnea    with pregnancy  . Vaginal delivery 2002, 2007, 2012    Patient Active Problem List   Diagnosis Date Noted  . Abnormal MSAFP (maternal serum alpha-fetoprotein), elevated 11/12/2014  . Pregnancy affected by fetal growth restriction 11/12/2014  . Single umbilical artery, maternal, antepartum 11/12/2014  . Nocturnal hypoxemia due to emphysema (HCC) 11/05/2010  . OSA (obstructive sleep apnea) 10/09/2010    Past Surgical History:  Procedure Laterality Date  . DILATION AND EVACUATION N/A 11/20/2014   Procedure: DILATATION AND EVACUATION (D&E) 2ND TRIMESTER;  Surgeon: Levi Aland, MD;  Location: [facility];  Service: Gynecology;  Laterality: N/A;  . INDUCED ABORTION  2000 and 2006  . WISDOM TOOTH EXTRACTION  1997      OB History    Gravida  7   Para  3   Term  3   Preterm  0   AB  3   Living  3     SAB  1   TAB  2   Ectopic  0   Multiple  0   Live Births  1           Family History  Problem Relation Age of Onset  . Heart disease Mother   . Heart disease Father   . Asthma Brother     Social History   Tobacco Use  . Smoking status: Never Smoker  . Smokeless tobacco: Never Used  Substance Use Topics  . Alcohol use: Yes  . Drug use: No    Home Medications Prior to Admission medications   Medication Sig Start Date End Date Taking? Authorizing Provider  hydrocodone-ibuprofen (VICOPROFEN) 5-200 MG per tablet Take 1 tablet by mouth every 8 (eight) hours as needed for pain. 11/20/14   Levi Aland, MD  methylergonovine (METHERGINE) 0.2 MG tablet Take 1 tablet (0.2 mg total) by mouth every 6 (six) hours. 11/20/14   Levi Aland, MD  Prenatal Vit-Fe Fumarate-FA (PRENATAL MULTIVITAMIN) TABS tablet Take 1 tablet by mouth daily.     [provider]    Allergies    Other and Terconazole  Review of Systems   Review of Systems  Constitutional: Negative for chills and fever.  HENT: Positive for sore throat. Negative for congestion and rhinorrhea.   Eyes: Negative for redness and visual disturbance.  Respiratory: Negative for shortness of breath and wheezing.   Cardiovascular: Negative for chest pain and palpitations.  Gastrointestinal: Negative for nausea and vomiting.  Genitourinary: Negative for dysuria and urgency.  Musculoskeletal: Negative for arthralgias and myalgias.  Skin: Negative for pallor and wound.  Neurological: Negative for dizziness and headaches.    Physical Exam Updated Vital Signs BP (!) 149/91 (BP Location: Left Arm)   Pulse 92   Temp 98.7 F (37.1 C) (Oral)   Resp 16   Ht 5\' 7"  (1.702 m)   Wt 106.6 kg   LMP 12/21/2019 (Exact Date)   SpO2 98%   BMI 36.81 kg/m   Physical Exam Vitals and nursing note reviewed.   Constitutional:      General: She is not in acute distress.    Appearance: She is well-developed. She is not diaphoretic.  HENT:     Head: Normocephalic and atraumatic.     Mouth/Throat:     Comments: Swollen turbinates, posterior nasal drip, no noted sinus ttp, tm normal bilaterally. No anterior cervical lymphadenopathy.   Eyes:     Pupils: Pupils are equal, round, and reactive to light.  Cardiovascular:     Rate and Rhythm: Normal rate and regular rhythm.     Heart sounds: No murmur heard.  No friction rub. No gallop.   Pulmonary:     Effort: Pulmonary effort is normal.     Breath sounds: No wheezing or rales.  Abdominal:     General: There is no distension.     Palpations: Abdomen is soft.     Tenderness: There is no abdominal tenderness.  Musculoskeletal:        General: No tenderness.     Cervical back: Normal range of motion and neck supple.  Skin:    General: Skin is warm and dry.  Neurological:     Mental Status: She is alert and oriented to person, place, and time.  Psychiatric:        Behavior: Behavior normal.     ED Results / Procedures / Treatments   Labs (all labs ordered are listed, but only abnormal results are displayed) Labs Reviewed  GROUP A STREP BY PCR    EKG None  Radiology No results found.  Procedures Procedures (including critical care time)  Medications Ordered in ED Medications  dexamethasone (DECADRON) tablet 10 mg (10 mg Oral Given 12/28/19 2146)    ED Course  I have reviewed the triage vital signs and the nursing notes.  Pertinent labs & imaging results that were available during my care of the patient were reviewed by me and considered in my medical decision making (see chart for details).    MDM Rules/Calculators/A&P                          44 yo F with a chief complaints of sore throat.  Going on for the past few days.  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  On exam patient has findings of a  viral pharyngitis.  There is no tonsillar swelling no exudates.  No anterior cervical lymphadenopathy.  The patient had a strep test that was collected in triage.  Give a dose of Decadron here.  Strep is negative.  Discharge home.  10:08 PM:  I have  discussed the diagnosis/risks/treatment options with the patient and family and believe the pt to be eligible for discharge home to follow-up with PCP. We also discussed returning to the ED immediately if new or worsening sx occur. We discussed the sx which are most concerning (e.g., sudden worsening pain, fever, inability to tolerate by mouth) that necessitate immediate return. Medications administered to the patient during their visit and any new prescriptions provided to the patient are listed below.  Medications given during this visit Medications  dexamethasone (DECADRON) tablet 10 mg (10 mg Oral Given 12/28/19 2146)     The patient appears reasonably screen and/or stabilized for discharge and I doubt any other medical condition or other [facility] requiring further screening, evaluation, or treatment in the ED at this time prior to discharge.   Final Clinical Impression(s) / ED Diagnoses Final diagnoses:  Viral pharyngitis    Rx / DC Orders ED Discharge Orders    None       Melene Plan, DO 12/28/19 2209

## 2019-12-28 NOTE — ED Triage Notes (Signed)
Pt to ED with c/o tonsils feeling swollen since yesterday.  Pt denies any sore throat, sts just feels hard to swallow

## 2019-12-28 NOTE — ED Notes (Addendum)
Pt requested Covid test prior to be dc, test procedure explained to the pt who verbalized understanding,  when attempted to get the Covid test pt refuses to have it done. Pt states she will get it in another place. Provider notified.

## 2019-12-28 NOTE — Discharge Instructions (Signed)
Take tylenol 2 pills 4 times a day and motrin 4 pills 3 times a day.  Drink plenty of fluids.  Return for worsening shortness of breath, headache, confusion. Follow up with your family doctor.   

## 2021-02-27 ENCOUNTER — Encounter: Payer: BC Managed Care – PPO | Admitting: Podiatry

## 2021-02-27 ENCOUNTER — Other Ambulatory Visit: Payer: Self-pay

## 2021-03-03 ENCOUNTER — Other Ambulatory Visit: Payer: Self-pay

## 2021-03-03 ENCOUNTER — Ambulatory Visit: Payer: BC Managed Care – PPO | Admitting: Podiatry

## 2021-03-03 ENCOUNTER — Ambulatory Visit (INDEPENDENT_AMBULATORY_CARE_PROVIDER_SITE_OTHER): Payer: BC Managed Care – PPO

## 2021-03-03 DIAGNOSIS — M722 Plantar fascial fibromatosis: Secondary | ICD-10-CM

## 2021-03-03 DIAGNOSIS — M79672 Pain in left foot: Secondary | ICD-10-CM | POA: Diagnosis not present

## 2021-03-03 MED ORDER — MELOXICAM 15 MG PO TABS: 15.0000 mg | ORAL_TABLET | Freq: Every day | ORAL | 0 refills | Status: DC | PRN

## 2021-03-03 NOTE — Patient Instructions (Signed)
For instructions on how to put on your Plantar Fascial Brace, please visit [email or web address]   Plantar Fasciitis (Heel Spur Syndrome) with Rehab The plantar fascia is a fibrous, ligament-like, soft-tissue structure that spans the bottom of the foot. Plantar fasciitis is a condition that causes pain in the foot due to inflammation of the tissue. SYMPTOMS   Pain and tenderness on the underneath side of the foot.  Pain that worsens with standing or walking. CAUSES  Plantar fasciitis is caused by irritation and injury to the plantar fascia on the underneath side of the foot. Common mechanisms of injury include:  Direct trauma to bottom of the foot.  Damage to a small nerve that runs under the foot where the main fascia attaches to the heel bone.  Stress placed on the plantar fascia due to bone spurs. RISK INCREASES WITH:   Activities that place stress on the plantar fascia (running, jumping, pivoting, or cutting).  Poor strength and flexibility.  Improperly fitted shoes.  Tight calf muscles.  Flat feet.  Failure to warm-up properly before activity.  Obesity. PREVENTION  Warm up and stretch properly before activity.  Allow for adequate recovery between workouts.  Maintain physical fitness:  Strength, flexibility, and endurance.  Cardiovascular fitness.  Maintain a health body weight.  Avoid stress on the plantar fascia.  Wear properly fitted shoes, including arch supports for individuals who have flat feet.  PROGNOSIS  If treated properly, then the symptoms of plantar fasciitis usually resolve without surgery. However, occasionally surgery is necessary.  RELATED COMPLICATIONS   Recurrent symptoms that may result in a chronic condition.  Problems of the lower back that are caused by compensating for the injury, such as limping.  Pain or weakness of the foot during push-off following surgery.  Chronic inflammation, scarring, and partial or complete  fascia tear, occurring more often from repeated injections.  TREATMENT  Treatment initially involves the use of ice and medication to help reduce pain and inflammation. The use of strengthening and stretching exercises may help reduce pain with activity, especially stretches of the Achilles tendon. These exercises may be performed at home or with a therapist. Your caregiver may recommend that you use heel cups of arch supports to help reduce stress on the plantar fascia. Occasionally, corticosteroid injections are given to reduce inflammation. If symptoms persist for greater than 6 months despite non-surgical (conservative), then surgery may be recommended.   MEDICATION   If pain medication is necessary, then nonsteroidal anti-inflammatory medications, such as aspirin and ibuprofen, or other minor pain relievers, such as acetaminophen, are often recommended.  Do not take pain medication within 7 days before surgery.  Prescription pain relievers may be given if deemed necessary by your caregiver. Use only as directed and only as much as you need.  Corticosteroid injections may be given by your caregiver. These injections should be reserved for the most serious cases, because they may only be given a certain number of times.  HEAT AND COLD  Cold treatment (icing) relieves pain and reduces inflammation. Cold treatment should be applied for 10 to 15 minutes every 2 to 3 hours for inflammation and pain and immediately after any activity that aggravates your symptoms. Use ice packs or massage the area with a piece of ice (ice massage).  Heat treatment may be used prior to performing the stretching and strengthening activities prescribed by your caregiver, physical therapist, or athletic trainer. Use a heat pack or soak the injury in warm water.  SEEK IMMEDIATE MEDICAL   CARE IF:  Treatment seems to offer no benefit, or the condition worsens.  Any medications produce adverse side effects.   EXERCISES- RANGE OF MOTION (ROM) AND STRETCHING EXERCISES - Plantar Fasciitis (Heel Spur Syndrome) These exercises may help you when beginning to rehabilitate your injury. Your symptoms may resolve with or without further involvement from your physician, physical therapist or athletic trainer. While completing these exercises, remember:   Restoring tissue flexibility helps normal motion to return to the joints. This allows healthier, less painful movement and activity.  An effective stretch should be held for at least 30 seconds.  A stretch should never be painful. You should only feel a gentle lengthening or release in the stretched tissue.  RANGE OF MOTION - Toe Extension, Flexion  Sit with your right / left leg crossed over your opposite knee.  Grasp your toes and gently pull them back toward the top of your foot. You should feel a stretch on the bottom of your toes and/or foot.  Hold this stretch for 10 seconds.  Now, gently pull your toes toward the bottom of your foot. You should feel a stretch on the top of your toes and or foot.  Hold this stretch for 10 seconds. Repeat  times. Complete this stretch 3 times per day.   RANGE OF MOTION - Ankle Dorsiflexion, Active Assisted  Remove shoes and sit on a chair that is preferably not on a carpeted surface.  Place right / left foot under knee. Extend your opposite leg for support.  Keeping your heel down, slide your right / left foot back toward the chair until you feel a stretch at your ankle or calf. If you do not feel a stretch, slide your bottom forward to the edge of the chair, while still keeping your heel down.  Hold this stretch for 10 seconds. Repeat 3 times. Complete this stretch 2 times per day.   STRETCH  Gastroc, Standing  Place hands on wall.  Extend right / left leg, keeping the front knee somewhat bent.  Slightly point your toes inward on your back foot.  Keeping your right / left heel on the floor and your  knee straight, shift your weight toward the wall, not allowing your back to arch.  You should feel a gentle stretch in the right / left calf. Hold this position for 10 seconds. Repeat 3 times. Complete this stretch 2 times per day.  STRETCH  Soleus, Standing  Place hands on wall.  Extend right / left leg, keeping the other knee somewhat bent.  Slightly point your toes inward on your back foot.  Keep your right / left heel on the floor, bend your back knee, and slightly shift your weight over the back leg so that you feel a gentle stretch deep in your back calf.  Hold this position for 10 seconds. Repeat 3 times. Complete this stretch 2 times per day.  STRETCH  Gastrocsoleus, Standing  Note: This exercise can place a lot of stress on your foot and ankle. Please complete this exercise only if specifically instructed by your caregiver.   Place the ball of your right / left foot on a step, keeping your other foot firmly on the same step.  Hold on to the wall or a rail for balance.  Slowly lift your other foot, allowing your body weight to press your heel down over the edge of the step.  You should feel a stretch in your right / left calf.  Hold this   position for 10 seconds.  Repeat this exercise with a slight bend in your right / left knee. Repeat 3 times. Complete this stretch 2 times per day.   STRENGTHENING EXERCISES - Plantar Fasciitis (Heel Spur Syndrome)  These exercises may help you when beginning to rehabilitate your injury. They may resolve your symptoms with or without further involvement from your physician, physical therapist or athletic trainer. While completing these exercises, remember:   Muscles can gain both the endurance and the strength needed for everyday activities through controlled exercises.  Complete these exercises as instructed by your physician, physical therapist or athletic trainer. Progress the resistance and repetitions only as guided.  STRENGTH -  Towel Curls  Sit in a chair positioned on a non-carpeted surface.  Place your foot on a towel, keeping your heel on the floor.  Pull the towel toward your heel by only curling your toes. Keep your heel on the floor. Repeat 3 times. Complete this exercise 2 times per day.  STRENGTH - Ankle Inversion  Secure one end of a rubber exercise band/tubing to a fixed object (table, pole). Loop the other end around your foot just before your toes.  Place your fists between your knees. This will focus your strengthening at your ankle.  Slowly, pull your big toe up and in, making sure the band/tubing is positioned to resist the entire motion.  Hold this position for 10 seconds.  Have your muscles resist the band/tubing as it slowly pulls your foot back to the starting position. Repeat 3 times. Complete this exercises 2 times per day.  Document Released: 02/22/2005 Document Revised: 05/17/2011 Document Reviewed: 06/06/2008 [employer] Patient Information 2014 [employer]. 
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
## 2021-03-04 NOTE — Progress Notes (Signed)
Subjective:   Patient ID: Julie Copeland, female   DOB: 45 y.o.   MRN: [id number]   HPI 45 year old female presents the office today for concerns of left heel pain which started in October.  She said that she stepped on a rock and she may have twisted her foot some nausea and symptoms started.  The right did not titrate her shoe or the foot itself.  In the morning when she gets up her pain level is a 10.  She gets soreness and achiness throughout the day.  No recent treatment.  No other concerns.   Review of Systems  All other systems reviewed and are negative.  Past Medical History:  Diagnosis Date   Childhood asthma    Chlamydia    Hypertension in pregnancy    No pertinent past medical history    Sleep apnea    with pregnancy   Vaginal delivery 2002, 2007, 2012    Past Surgical History:  Procedure Laterality Date   DILATION AND EVACUATION N/A 11/20/2014   Procedure: DILATATION AND EVACUATION (D&E) 2ND TRIMESTER;  Surgeon: Levi Aland, MD;  Location: [facility];  Service: Gynecology;  Laterality: N/A;   INDUCED ABORTION  2000 and 2006   WISDOM TOOTH EXTRACTION  1997     Current Outpatient Medications:    meloxicam (MOBIC) 15 MG tablet, Take 1 tablet (15 mg total) by mouth daily as needed for pain., Disp: 30 tablet, Rfl: 0   hydrocodone-ibuprofen (VICOPROFEN) 5-200 MG per tablet, Take 1 tablet by mouth every 8 (eight) hours as needed for pain., Disp: 10 tablet, Rfl: 0   methylergonovine (METHERGINE) 0.2 MG tablet, Take 1 tablet (0.2 mg total) by mouth every 6 (six) hours., Disp: 6 tablet, Rfl: 0   Prenatal Vit-Fe Fumarate-FA (PRENATAL MULTIVITAMIN) TABS tablet, Take 1 tablet by mouth daily. , Disp: , Rfl:   Allergies  Allergen Reactions   Other Hives, Itching and Other (See Comments)    Pt states that she is allergic to a variety of nuts.     Terconazole Itching, Swelling and Rash         Objective:  Physical Exam  General: AAO x3, NAD  Dermatological: Skin  is warm, dry and supple bilateral.  There are no open sores, no preulcerative lesions, no rash or signs of infection present.  Vascular: Dorsalis Pedis artery and Posterior Tibial artery pedal pulses are 2/4 bilateral with immedate capillary fill time.  There is no pain with calf compression, swelling, warmth, erythema.   Neruologic: Grossly intact via light touch bilateral.  Negative Tinel sign.  Musculoskeletal: Tenderness palpation on plantar medial tubercle of the calcaneus at the insertion of plantar fascia on the left side.  Mild discomfort to the foot.  No pain on lateral compression of calcaneus.  No pain with Achilles tendon.  No edema.  Muscular strength 5/5 in all groups tested bilateral.  Gait: Unassisted, Nonantalgic.       Assessment:   45 year old female with Plantar fasciitis, strain left heel     Plan:  -Treatment options discussed including all alternatives, risks, and complications -Etiology of symptoms were discussed -X-rays were obtained and reviewed with the patient. Calcaneal spurring is present.  No notes of acute fracture. -Prescribed mobic. Discussed side effects of the medication and directed to stop if any are to occur and call the office.  -We discussed stretching, icing daily.  Dispensed plantar fascial brace.  Discussed shoes and good arch support.  Vivi Barrack DPM

## 2021-08-02 IMAGING — US US BREAST*R* LIMITED INC AXILLA
1 series · 11 of 11 positions shown · non-contrast
Comparison: Previous exam(s).

CLINICAL DATA: Mass felt by the patient in the posterior aspect of
the upper-outer right breast since 06/06/2019.

EXAM:
DIGITAL DIAGNOSTIC RIGHT MAMMOGRAM WITH CAD AND TOMO
ULTRASOUND RIGHT BREAST

[Series 1: us breast*right* limited inc axilla · 0.05mm/px · 11 of 11 slices shown]
[im 1/11]
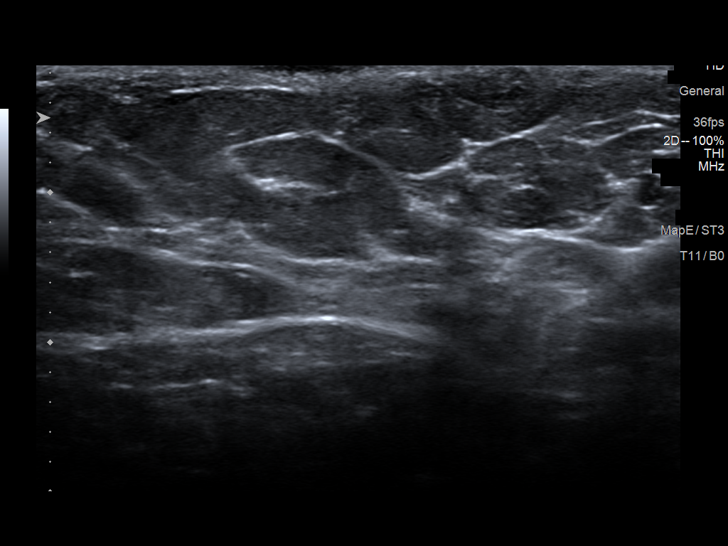
[im 2/11]
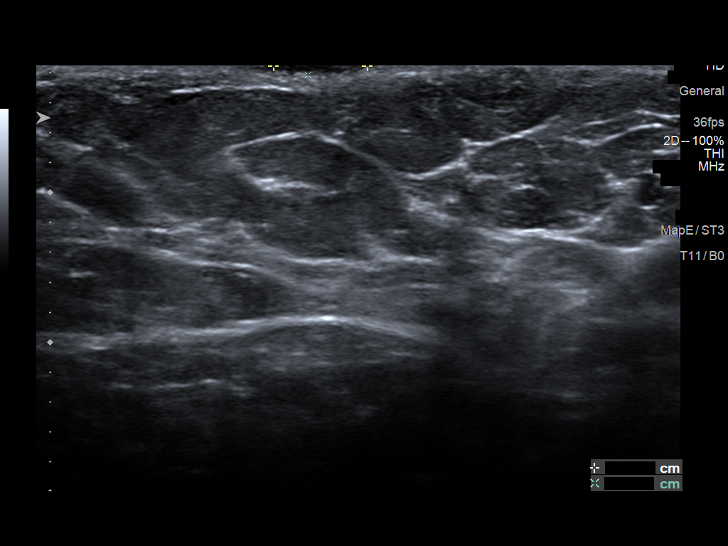
[im 3/11]
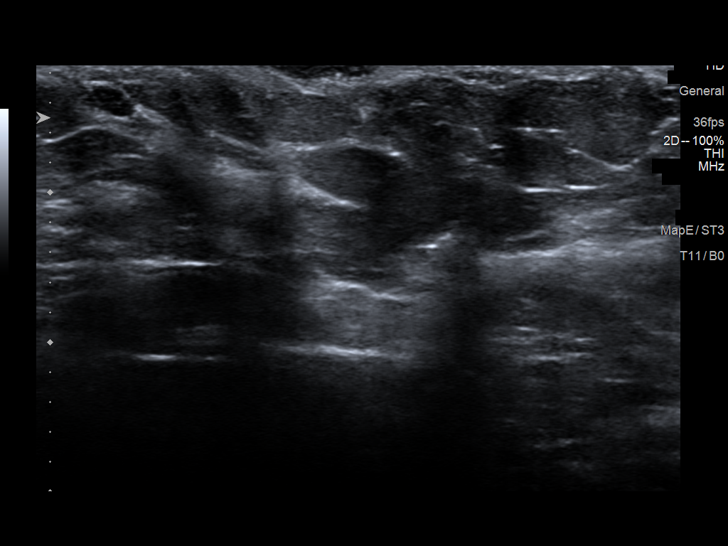
[im 4/11]
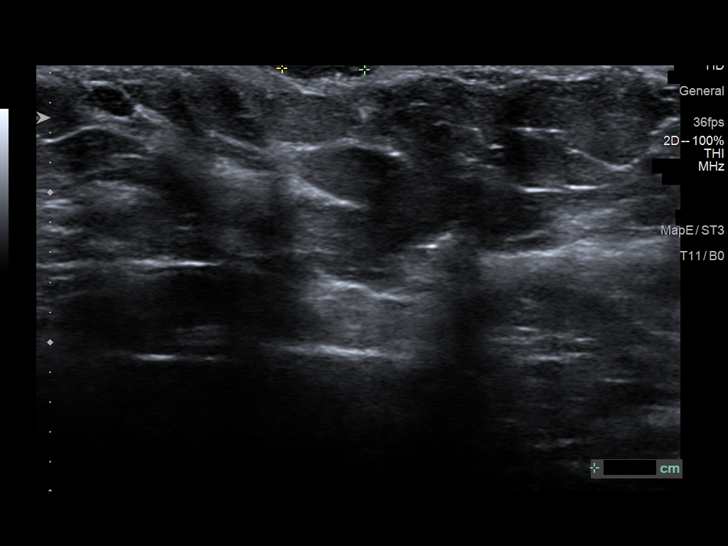
[im 5/11]
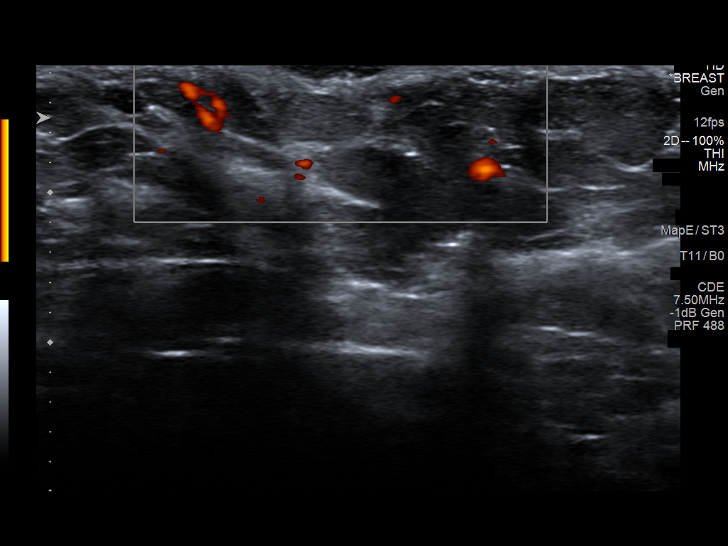
[im 6/11]
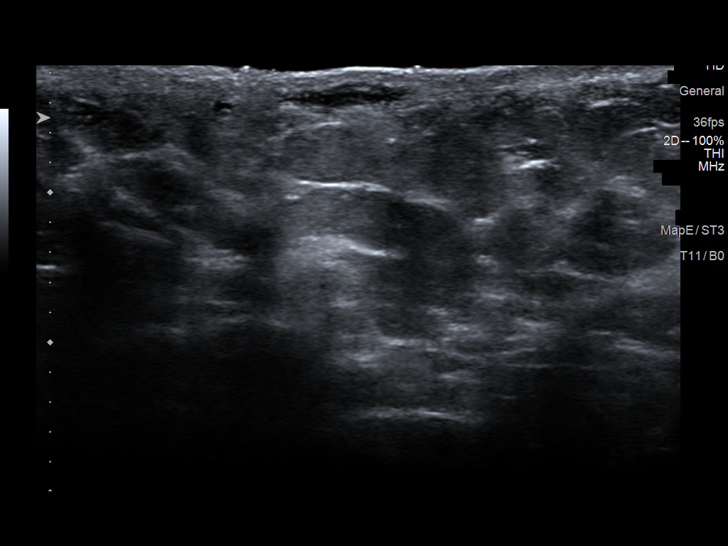
[im 7/11]
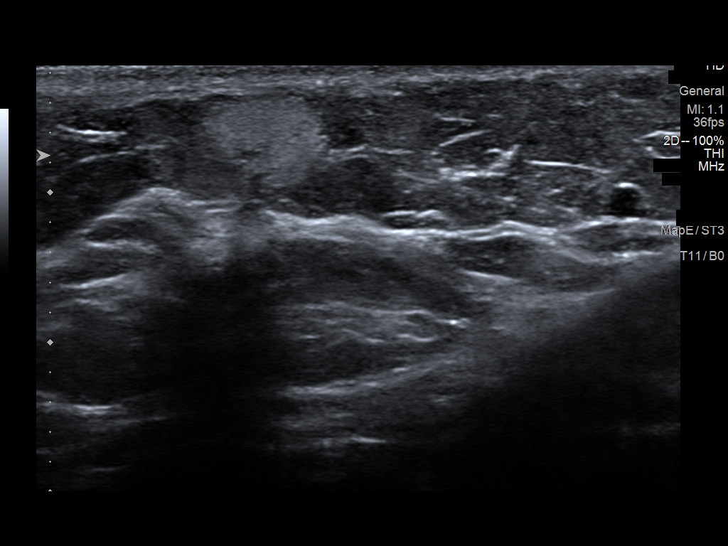
[im 8/11]
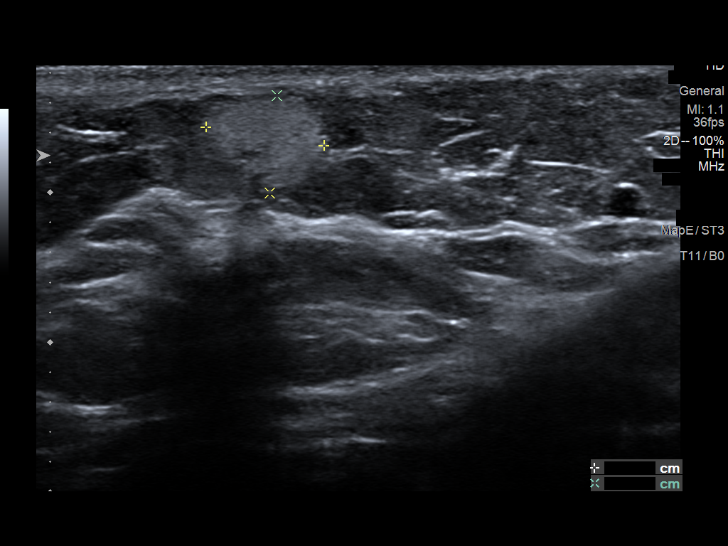
[im 9/11]
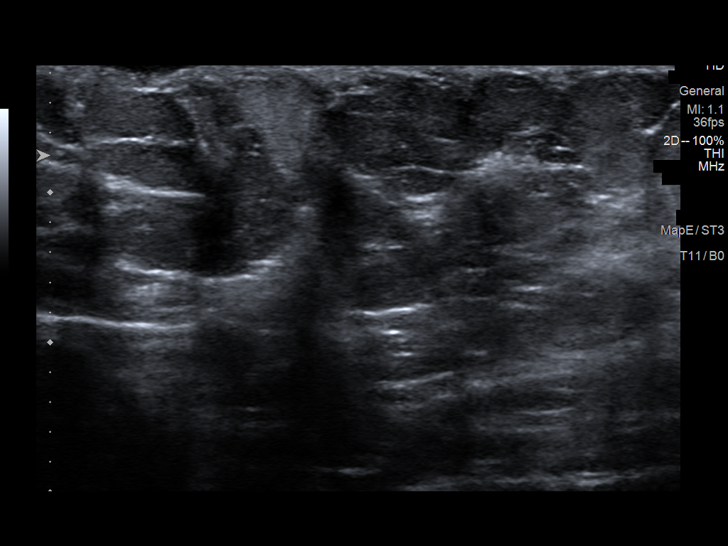
[im 10/11]
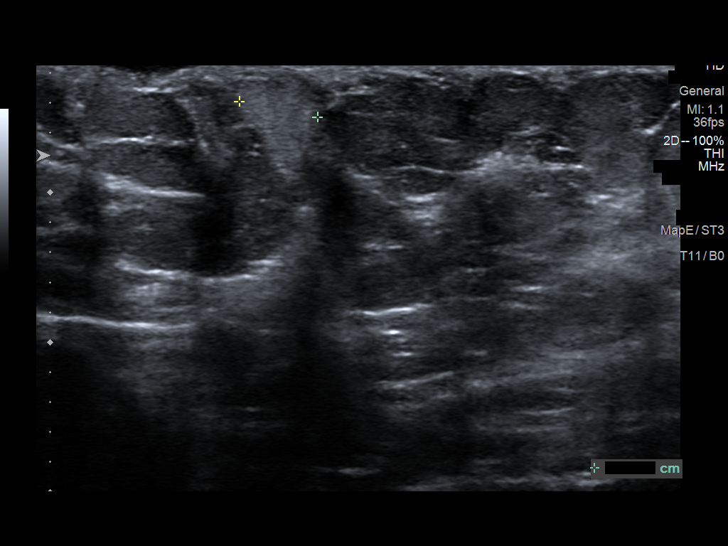
[im 11/11]
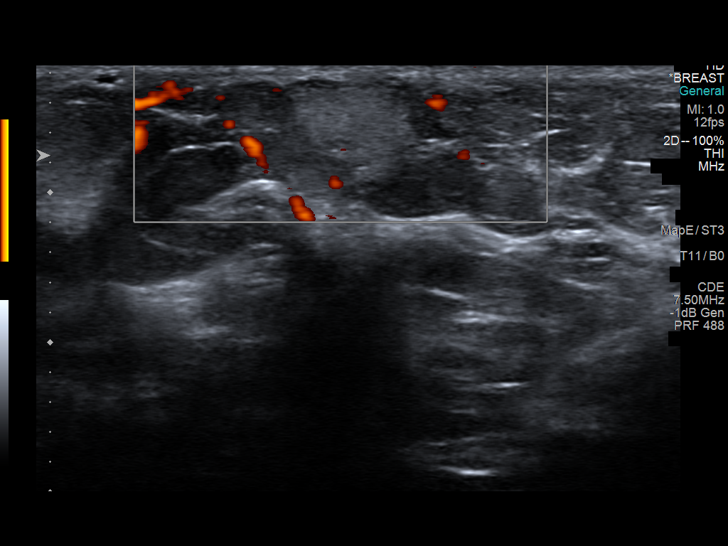

[11 of 11 positions shown; findings below may reference images not displayed]

ACR Breast Density Category b: There are scattered areas of
fibroglandular density.
FINDINGS: Stable mammographic appearance of the right breast with no findings
suspicious for malignancy. No mass or other abnormality seen at the
location of the mass felt by the patient, marked with a metallic
marker.

Mammographic images were processed with CAD.

On physical exam, the patient is focally tender to palpation in the
axillary tail region of the right breast. There is a tiny raised,
dark skin punctum or blackhead in the skin at that location. This
associated with mild palpable soft tissue thickening in the skin.
Otherwise, there is no palpable mass.

Targeted ultrasound is performed, showing a 6 x 6 x 1 mm elongated,
oval, hypoechoic fluid collection in the skin in an area of mild
skin thickening at the location of the small blackhead or raised
skin punctum.

Just inferior to that location, there is an 8 x 7 x 5 mm oval,
echogenic, circumscribed mass within the subcutaneous fat. No other
abnormalities were seen.
IMPRESSION: 1. Small blackhead or sebaceous cyst in the axillary tail region of
the right breast at the location of patient concern.
2. 8 mm subcutaneous lipoma just inferior to that location.
3. No evidence of malignancy.

RECOMMENDATION:
Bilateral screening mammogram in 1 month. That will be 1 year since
mammographic evaluation of the left breast.

I have discussed the findings and recommendations with the patient.
If applicable, a reminder letter will be sent to the patient
regarding the next appointment.

BI-RADS CATEGORY  2: Benign.

## 2021-08-02 IMAGING — MG MM DIGITAL DIAGNOSTIC UNILAT*R* W/ TOMO W/ CAD
6 series · 6 of 18 positions shown · non-contrast
Comparison: Previous exam(s).

CLINICAL DATA: Mass felt by the patient in the posterior aspect of
the upper-outer right breast since 06/06/2019.

EXAM:
DIGITAL DIAGNOSTIC RIGHT MAMMOGRAM WITH CAD AND TOMO
ULTRASOUND RIGHT BREAST

[R MLO synth-2D]
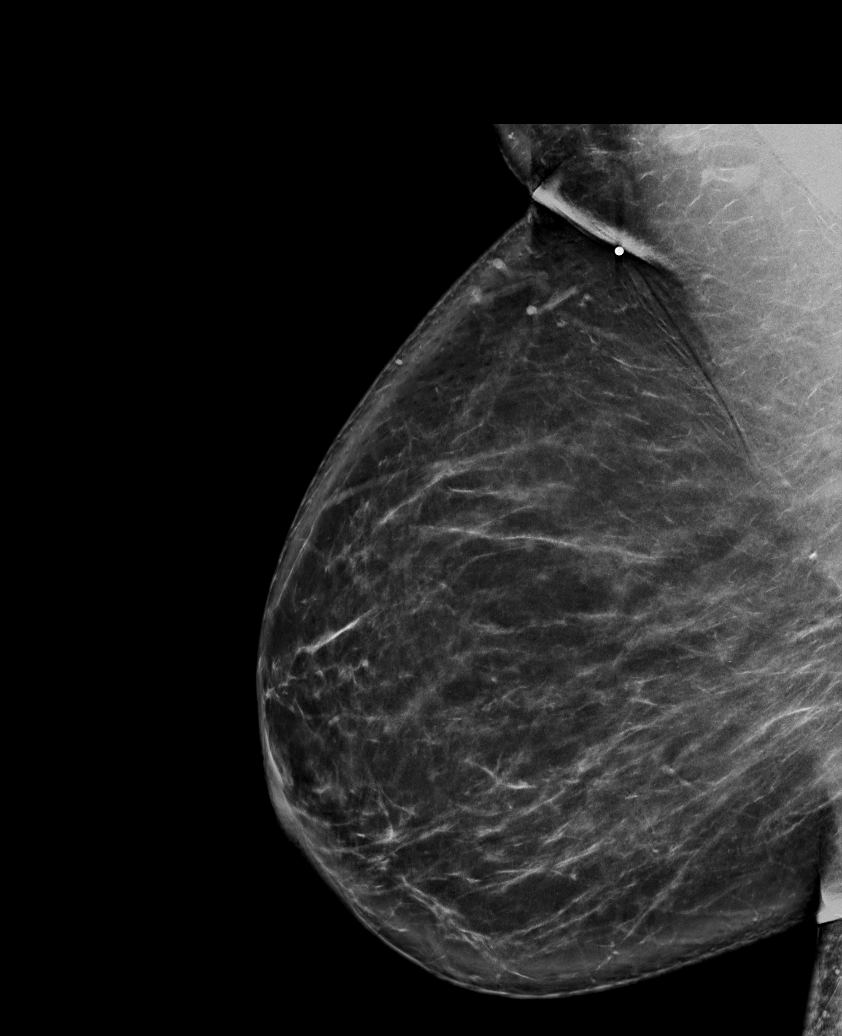

[R CC synth-2D]
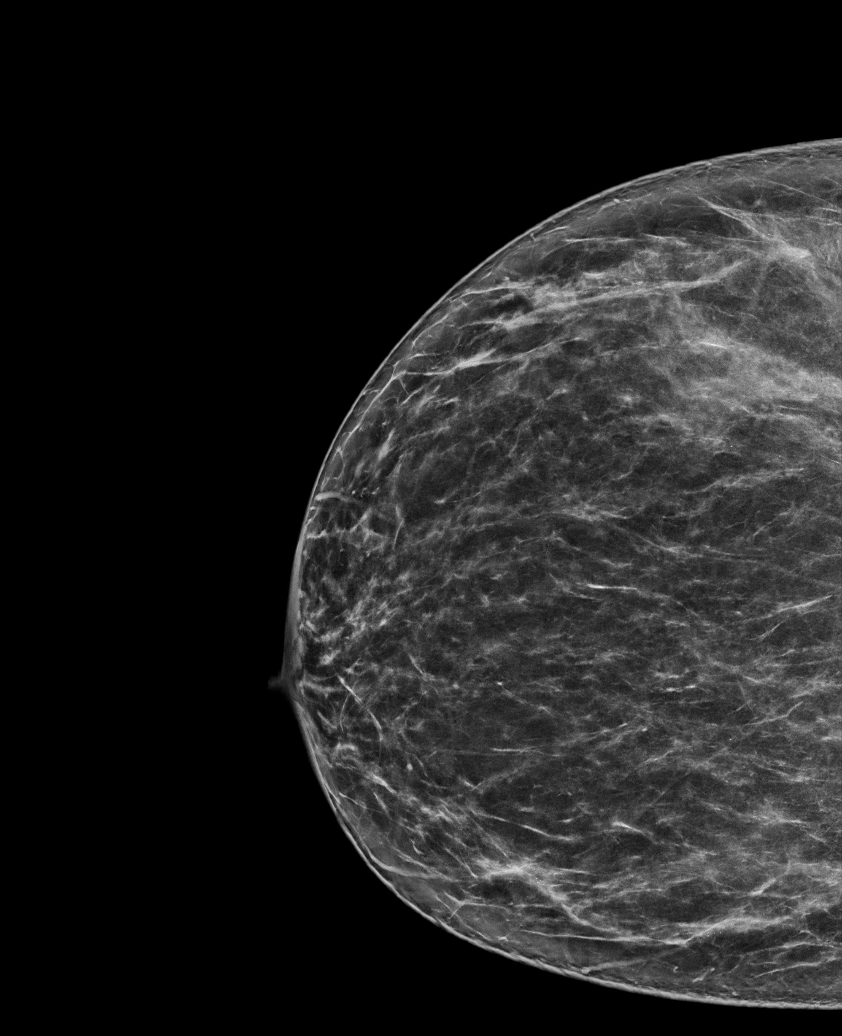

[R TAN synth-2D]
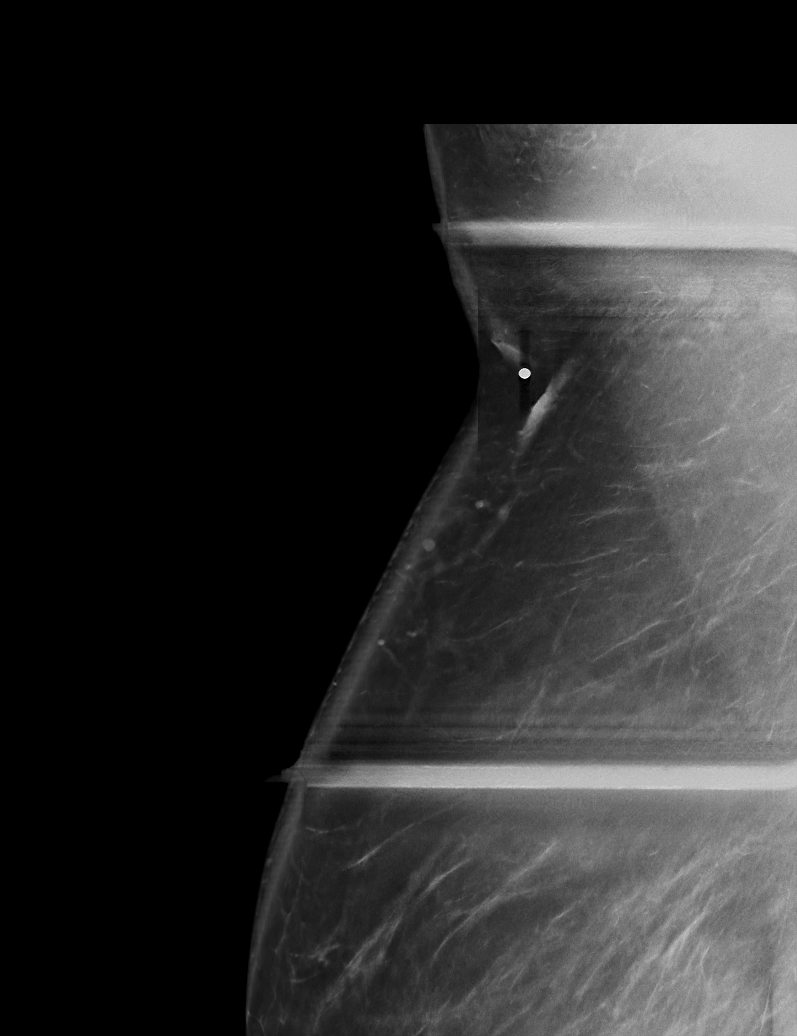

[R CC tomo · tomo slice 43/86.0]
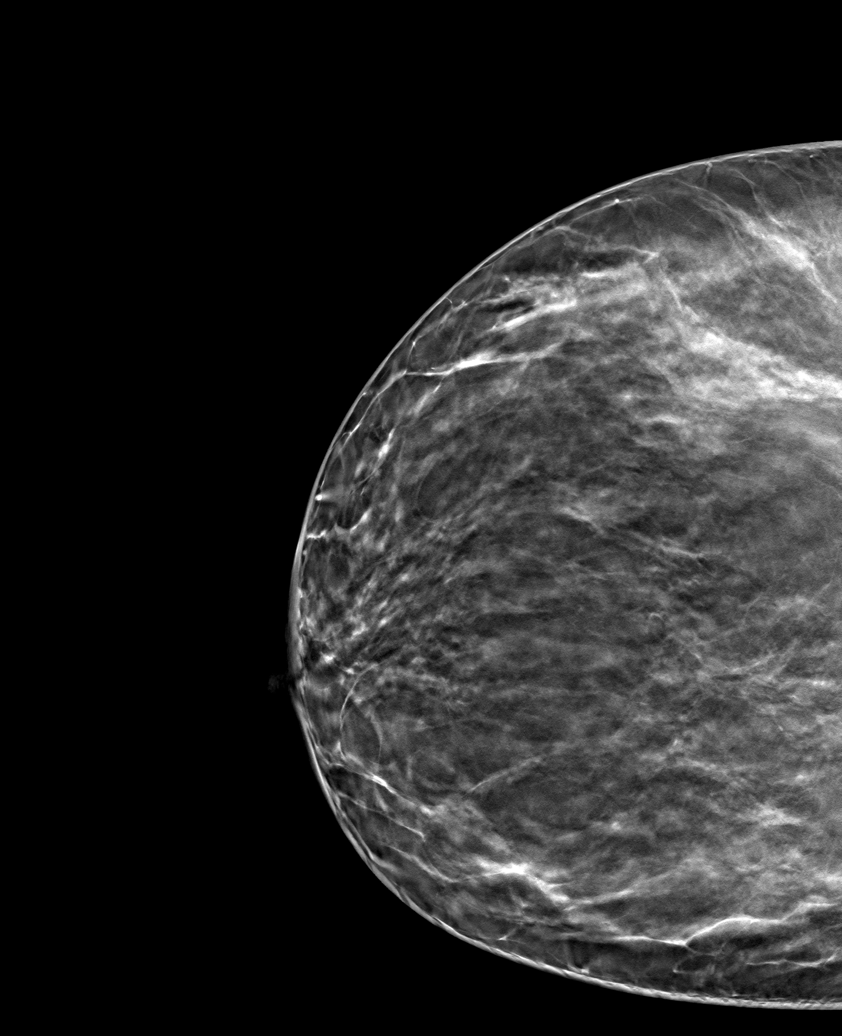

[R TAN tomo · tomo slice 63/125.0]
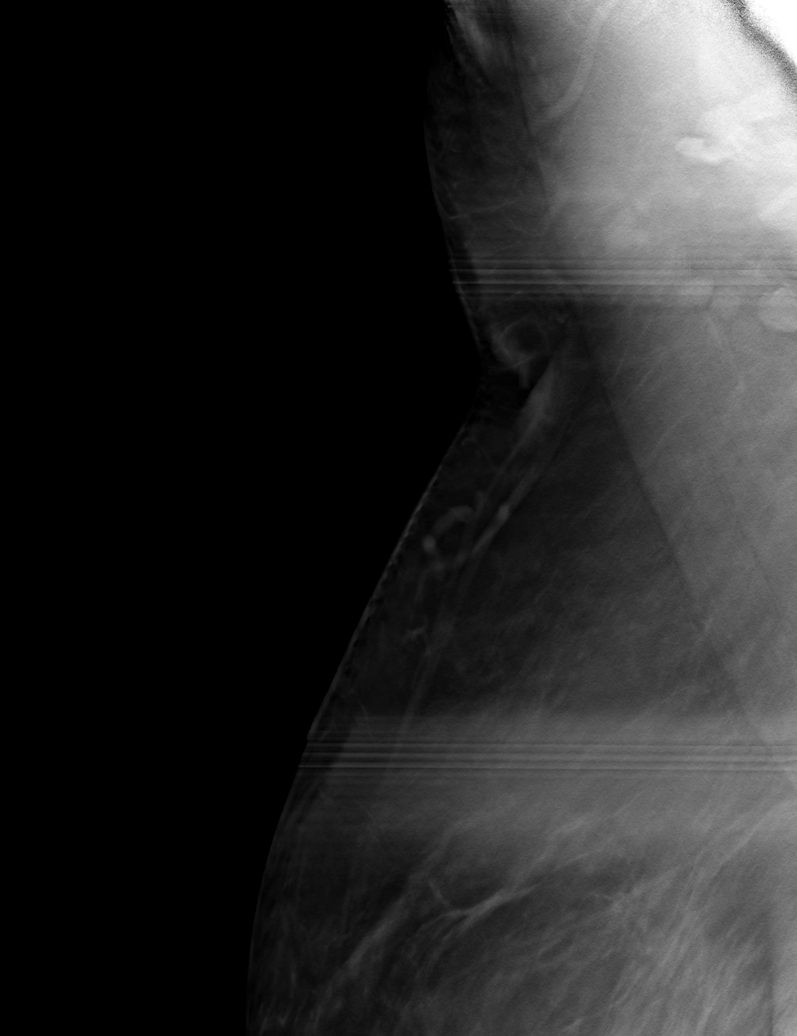

[R MLO tomo · tomo slice 57/112.0]
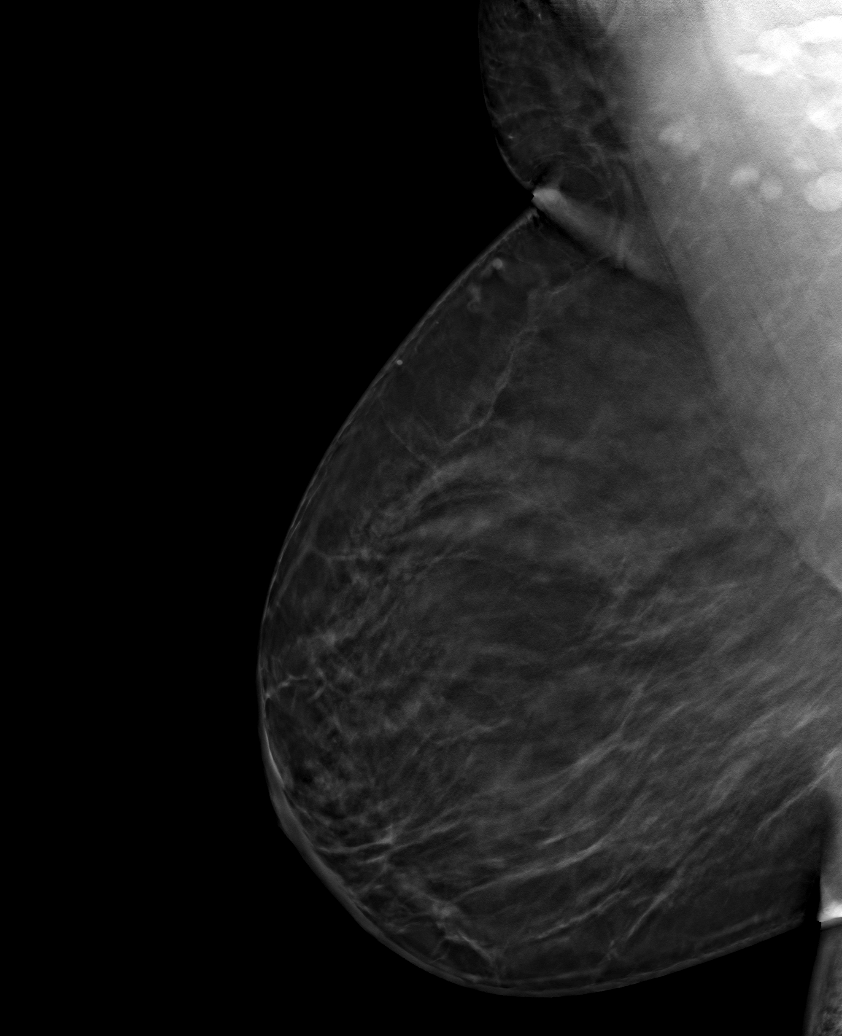

[6 of 18 positions shown; findings below may reference images not displayed]

ACR Breast Density Category b: There are scattered areas of
fibroglandular density.
FINDINGS: Stable mammographic appearance of the right breast with no findings
suspicious for malignancy. No mass or other abnormality seen at the
location of the mass felt by the patient, marked with a metallic
marker.

Mammographic images were processed with CAD.

On physical exam, the patient is focally tender to palpation in the
axillary tail region of the right breast. There is a tiny raised,
dark skin punctum or blackhead in the skin at that location. This
associated with mild palpable soft tissue thickening in the skin.
Otherwise, there is no palpable mass.

Targeted ultrasound is performed, showing a 6 x 6 x 1 mm elongated,
oval, hypoechoic fluid collection in the skin in an area of mild
skin thickening at the location of the small blackhead or raised
skin punctum.

Just inferior to that location, there is an 8 x 7 x 5 mm oval,
echogenic, circumscribed mass within the subcutaneous fat. No other
abnormalities were seen.
IMPRESSION: 1. Small blackhead or sebaceous cyst in the axillary tail region of
the right breast at the location of patient concern.
2. 8 mm subcutaneous lipoma just inferior to that location.
3. No evidence of malignancy.

RECOMMENDATION:
Bilateral screening mammogram in 1 month. That will be 1 year since
mammographic evaluation of the left breast.

I have discussed the findings and recommendations with the patient.
If applicable, a reminder letter will be sent to the patient
regarding the next appointment.

BI-RADS CATEGORY  2: Benign.

## 2021-10-16 LAB — HM PAP SMEAR: HM Pap smear: NORMAL

## 2021-10-19 ENCOUNTER — Ambulatory Visit (INDEPENDENT_AMBULATORY_CARE_PROVIDER_SITE_OTHER): Payer: BC Managed Care – PPO

## 2021-10-19 ENCOUNTER — Ambulatory Visit: Payer: BC Managed Care – PPO | Admitting: Podiatry

## 2021-10-19 DIAGNOSIS — M722 Plantar fascial fibromatosis: Secondary | ICD-10-CM | POA: Diagnosis not present

## 2021-10-19 DIAGNOSIS — M7732 Calcaneal spur, left foot: Secondary | ICD-10-CM | POA: Diagnosis not present

## 2021-10-19 MED ORDER — METHYLPREDNISOLONE 4 MG PO TBPK: ORAL_TABLET | ORAL | 0 refills | Status: DC

## 2021-10-19 MED ORDER — MELOXICAM 15 MG PO TABS: 15.0000 mg | ORAL_TABLET | Freq: Every day | ORAL | 0 refills | Status: DC | PRN

## 2021-10-19 NOTE — Progress Notes (Signed)
Subjective: Chief Complaint  Patient presents with   Plantar Fasciitis    Patient came in today for Plantar fasciitis left follow-up, pt is having pain in the heel of the foot, rat of pain is a 9 out of 10, pt is having pain first thing in the morning, dull and sharp pain, X-Rays were taken today, TX: Brace, Mobic     Still taking mobic prn  Denies any systemic complaints such as fevers, chills, nausea, vomiting. No acute changes since last appointment, and no other complaints at this time.   Objective: AAO x3, NAD DP/PT pulses palpable bilaterally, CRT less than 3 seconds Protective sensation intact with Simms Weinstein monofilament, vibratory sensation intact, Achilles tendon reflex intact No areas of pinpoint bony tenderness or pain with vibratory sensation. MMT 5/5, ROM WNL. No edema, erythema, increase in warmth to bilateral lower extremities.  No open lesions or pre-ulcerative lesions.  No pain with calf compression, swelling, warmth, erythema  Assessment:  Plan: -All treatment options discussed with the patient including all alternatives, risks, complications.   -Patient encouraged to call the office with any questions, concerns, change in symptoms.

## 2021-10-19 NOTE — Patient Instructions (Signed)
For instructions on how to put on your Night Splint, please visit [email or web address]   Start with the medrol dose pack (steroid). Once complete you complete you can start the mobic.   Plantar Fasciitis (Heel Spur Syndrome) with Rehab The plantar fascia is a fibrous, ligament-like, soft-tissue structure that spans the bottom of the foot. Plantar fasciitis is a condition that causes pain in the foot due to inflammation of the tissue. SYMPTOMS  Pain and tenderness on the underneath side of the foot. Pain that worsens with standing or walking. CAUSES  Plantar fasciitis is caused by irritation and injury to the plantar fascia on the underneath side of the foot. Common mechanisms of injury include: Direct trauma to bottom of the foot. Damage to a small nerve that runs under the foot where the main fascia attaches to the heel bone. Stress placed on the plantar fascia due to bone spurs. RISK INCREASES WITH:  Activities that place stress on the plantar fascia (running, jumping, pivoting, or cutting). Poor strength and flexibility. Improperly fitted shoes. Tight calf muscles. Flat feet. Failure to warm-up properly before activity. Obesity. PREVENTION Warm up and stretch properly before activity. Allow for adequate recovery between workouts. Maintain physical fitness: Strength, flexibility, and endurance. Cardiovascular fitness. Maintain a health body weight. Avoid stress on the plantar fascia. Wear properly fitted shoes, including arch supports for individuals who have flat feet.  PROGNOSIS  If treated properly, then the symptoms of plantar fasciitis usually resolve without surgery. However, occasionally surgery is necessary.  RELATED COMPLICATIONS  Recurrent symptoms that may result in a chronic condition. Problems of the lower back that are caused by compensating for the injury, such as limping. Pain or weakness of the foot during push-off following surgery. Chronic  inflammation, scarring, and partial or complete fascia tear, occurring more often from repeated injections.  TREATMENT  Treatment initially involves the use of ice and medication to help reduce pain and inflammation. The use of strengthening and stretching exercises may help reduce pain with activity, especially stretches of the Achilles tendon. These exercises may be performed at home or with a therapist. Your caregiver may recommend that you use heel cups of arch supports to help reduce stress on the plantar fascia. Occasionally, corticosteroid injections are given to reduce inflammation. If symptoms persist for greater than 6 months despite non-surgical (conservative), then surgery may be recommended.   MEDICATION  If pain medication is necessary, then nonsteroidal anti-inflammatory medications, such as aspirin and ibuprofen, or other minor pain relievers, such as acetaminophen, are often recommended. Do not take pain medication within 7 days before surgery. Prescription pain relievers may be given if deemed necessary by your caregiver. Use only as directed and only as much as you need. Corticosteroid injections may be given by your caregiver. These injections should be reserved for the most serious cases, because they may only be given a certain number of times.  HEAT AND COLD Cold treatment (icing) relieves pain and reduces inflammation. Cold treatment should be applied for 10 to 15 minutes every 2 to 3 hours for inflammation and pain and immediately after any activity that aggravates your symptoms. Use ice packs or massage the area with a piece of ice (ice massage). Heat treatment may be used prior to performing the stretching and strengthening activities prescribed by your caregiver, physical therapist, or athletic trainer. Use a heat pack or soak the injury in warm water.  SEEK IMMEDIATE MEDICAL CARE IF: Treatment seems to offer no benefit, or the condition worsens. Any  medications produce  adverse side effects.  EXERCISES- RANGE OF MOTION (ROM) AND STRETCHING EXERCISES - Plantar Fasciitis (Heel Spur Syndrome) These exercises may help you when beginning to rehabilitate your injury. Your symptoms may resolve with or without further involvement from your physician, physical therapist or athletic trainer. While completing these exercises, remember:  Restoring tissue flexibility helps normal motion to return to the joints. This allows healthier, less painful movement and activity. An effective stretch should be held for at least 30 seconds. A stretch should never be painful. You should only feel a gentle lengthening or release in the stretched tissue.  RANGE OF MOTION - Toe Extension, Flexion Sit with your right / left leg crossed over your opposite knee. Grasp your toes and gently pull them back toward the top of your foot. You should feel a stretch on the bottom of your toes and/or foot. Hold this stretch for 10 seconds. Now, gently pull your toes toward the bottom of your foot. You should feel a stretch on the top of your toes and or foot. Hold this stretch for 10 seconds. Repeat  times. Complete this stretch 3 times per day.   RANGE OF MOTION - Ankle Dorsiflexion, Active Assisted Remove shoes and sit on a chair that is preferably not on a carpeted surface. Place right / left foot under knee. Extend your opposite leg for support. Keeping your heel down, slide your right / left foot back toward the chair until you feel a stretch at your ankle or calf. If you do not feel a stretch, slide your bottom forward to the edge of the chair, while still keeping your heel down. Hold this stretch for 10 seconds. Repeat 3 times. Complete this stretch 2 times per day.   STRETCH  Gastroc, Standing Place hands on wall. Extend right / left leg, keeping the front knee somewhat bent. Slightly point your toes inward on your back foot. Keeping your right / left heel on the floor and your knee  straight, shift your weight toward the wall, not allowing your back to arch. You should feel a gentle stretch in the right / left calf. Hold this position for 10 seconds. Repeat 3 times. Complete this stretch 2 times per day.  STRETCH  Soleus, Standing Place hands on wall. Extend right / left leg, keeping the other knee somewhat bent. Slightly point your toes inward on your back foot. Keep your right / left heel on the floor, bend your back knee, and slightly shift your weight over the back leg so that you feel a gentle stretch deep in your back calf. Hold this position for 10 seconds. Repeat 3 times. Complete this stretch 2 times per day.  STRETCH  Gastrocsoleus, Standing  Note: This exercise can place a lot of stress on your foot and ankle. Please complete this exercise only if specifically instructed by your caregiver.  Place the ball of your right / left foot on a step, keeping your other foot firmly on the same step. Hold on to the wall or a rail for balance. Slowly lift your other foot, allowing your body weight to press your heel down over the edge of the step. You should feel a stretch in your right / left calf. Hold this position for 10 seconds. Repeat this exercise with a slight bend in your right / left knee. Repeat 3 times. Complete this stretch 2 times per day.   STRENGTHENING EXERCISES - Plantar Fasciitis (Heel Spur Syndrome)  These exercises may help  you when beginning to rehabilitate your injury. They may resolve your symptoms with or without further involvement from your physician, physical therapist or athletic trainer. While completing these exercises, remember:  Muscles can gain both the endurance and the strength needed for everyday activities through controlled exercises. Complete these exercises as instructed by your physician, physical therapist or athletic trainer. Progress the resistance and repetitions only as guided.  STRENGTH - Towel Curls Sit in a chair  positioned on a non-carpeted surface. Place your foot on a towel, keeping your heel on the floor. Pull the towel toward your heel by only curling your toes. Keep your heel on the floor. Repeat 3 times. Complete this exercise 2 times per day.  STRENGTH - Ankle Inversion Secure one end of a rubber exercise band/tubing to a fixed object (table, pole). Loop the other end around your foot just before your toes. Place your fists between your knees. This will focus your strengthening at your ankle. Slowly, pull your big toe up and in, making sure the band/tubing is positioned to resist the entire motion. Hold this position for 10 seconds. Have your muscles resist the band/tubing as it slowly pulls your foot back to the starting position. Repeat 3 times. Complete this exercises 2 times per day.  Document Released: 02/22/2005 Document Revised: 05/17/2011 Document Reviewed: 06/06/2008 [facility] Patient Information 2014 [place], [place].

## 2021-10-30 ENCOUNTER — Ambulatory Visit: Payer: BC Managed Care – PPO | Admitting: Podiatry

## 2021-11-22 LAB — HM MAMMOGRAPHY

## 2021-12-01 ENCOUNTER — Ambulatory Visit: Payer: BC Managed Care – PPO | Admitting: Podiatry

## 2022-02-10 ENCOUNTER — Ambulatory Visit
Admission: EM | Admit: 2022-02-10 | Discharge: 2022-02-10 | Disposition: A | Discharge status: Home / Self Care | Payer: Self-pay | Attending: Emergency Medicine | Admitting: Emergency Medicine

## 2022-02-10 DIAGNOSIS — M546 Pain in thoracic spine: Secondary | ICD-10-CM

## 2022-02-10 LAB — POCT URINALYSIS DIP (MANUAL ENTRY)
Bilirubin, UA: NEGATIVE
Blood, UA: NEGATIVE
Glucose, UA: NEGATIVE mg/dL
Ketones, POC UA: NEGATIVE mg/dL
Leukocytes, UA: NEGATIVE
Nitrite, UA: NEGATIVE
Protein Ur, POC: NEGATIVE mg/dL
Spec Grav, UA: 1.02 (ref 1.010–1.025)
Urobilinogen, UA: 0.2 E.U./dL
pH, UA: 6.5 (ref 5.0–8.0)

## 2022-02-10 MED ORDER — IBUPROFEN 800 MG PO TABS: 800.0000 mg | ORAL_TABLET | Freq: Three times a day (TID) | ORAL | 0 refills | Status: AC | PRN

## 2022-02-10 MED ORDER — KETOROLAC TROMETHAMINE 30 MG/ML IJ SOLN
30.0000 mg | Freq: Once | INTRAMUSCULAR | Status: AC
  Administered 2022-02-10: 30 mg via INTRAMUSCULAR

## 2022-02-10 MED ORDER — BACLOFEN 10 MG PO TABS: 10.0000 mg | ORAL_TABLET | Freq: Three times a day (TID) | ORAL | 0 refills | Status: AC

## 2022-02-10 NOTE — Discharge Instructions (Signed)
Your urinalysis is not concerning for kidney infection or kidney stone.  Physical exam is concerning for musculoskeletal pain.  Even though you have not had any acute injury or not aware of any reason for your pain, sometimes just having a little bit of pain can cause more pain by creating muscle tension which creates more pain.  You received an injection of ketorolac during her visit today which I hope provided you with some relief.  I sent a prescription for ibuprofen 100 mg tablets and baclofen to your pharmacy.  Ibuprofen, as you know is an anti-inflammatory pain medication that can be taken every 8 hours.  Baclofen is a muscle relaxer and an antispasm medication that can be taken 3 times daily to relieve some of the tension in your muscles that is causing pain.  I recommend that you alternate ice and heat to the affected area for the next few days and rest at home.  I provided you with a note to be out of work.  At any time, if you begin to have worsening pain, notice blood in your urine or begin to have fever, please go to the emergency right away for further evaluation.  You may require CT scan which is not something that we can order here at urgent care.  Thank you for coming to urgent care today.  I hope you feel better soon.

## 2022-02-10 NOTE — ED Provider Notes (Signed)
[facility]    CSN: [id number] Arrival date & time: 02/10/22  1658    HISTORY   Chief Complaint  Patient presents with   Flank Pain   HPI Julie Copeland is a pleasant, 46 y.o. female who presents to urgent care today. Patient complains of right-sided mid back pain that began 3 days ago.  Patient states has been taking Goody powders and Motrin with little relief of her symptoms.  Patient denies a history of kidney stones.  Patient denies family history of kidney stones.  Urinalysis today was unremarkable.  Patient denies known injury to her back, recent activities that may have caused her to have back pain.  Patient states she is never had similar back pain in the past.  Patient denies burning with urination, increased frequency of urination, fever, body aches, chills.  Patient has elevated blood pressure on arrival today with otherwise normal vital signs.  Patient denies shortness of breath but states that taking a deep breath does make her mid back pain worse.    Past Medical History:  Diagnosis Date   Childhood asthma    Chlamydia    Hypertension in pregnancy    No pertinent past medical history    Sleep apnea    with pregnancy   Vaginal delivery 2002, 2007, 2012   Patient Active Problem List   Diagnosis Date Noted   Abnormal MSAFP (maternal serum alpha-fetoprotein), elevated 11/12/2014   Pregnancy affected by fetal growth restriction 11/12/2014   Single umbilical artery, maternal, antepartum 11/12/2014   Nocturnal hypoxemia due to emphysema (HCC) 11/05/2010   OSA (obstructive sleep apnea) 10/09/2010   Past Surgical History:  Procedure Laterality Date   DILATION AND EVACUATION N/A 11/20/2014   Procedure: DILATATION AND EVACUATION (D&E) 2ND TRIMESTER;  Surgeon: Levi Aland, MD;  Location: [facility];  Service: Gynecology;  Laterality: N/A;   INDUCED ABORTION  2000 and 2006   WISDOM TOOTH EXTRACTION  1997   OB History     Gravida  7   Para  3    Term  3   Preterm  0   AB  3   Living  3      SAB  1   IAB  2   Ectopic  0   Multiple  0   Live Births  1          Home Medications    Prior to Admission medications   Medication Sig Start Date End Date Taking? Authorizing Provider  Cholecalciferol (VITAMIN D) 50 MCG (2000 UT) tablet 1 tablet    [provider]  losartan-hydrochlorothiazide (HYZAAR) 50-12.5 MG tablet 1 tablet 01/02/20   [provider]  meloxicam (MOBIC) 15 MG tablet Take 1 tablet (15 mg total) by mouth daily as needed for pain. 03/03/21 03/03/22  Vivi Barrack, DPM  meloxicam (MOBIC) 15 MG tablet Take 1 tablet (15 mg total) by mouth daily as needed for pain. 10/19/21 10/19/22  Vivi Barrack, DPM    Family History Family History  Problem Relation Age of Onset   Heart disease Mother    Heart disease Father    Asthma Brother    Social History Social History   Tobacco Use   Smoking status: Never   Smokeless tobacco: Never  Substance Use Topics   Alcohol use: Yes   Drug use: No   Allergies   Methylergonovine, Other, and Terconazole  Review of Systems Review of Systems Pertinent findings revealed after performing a 14  point review of systems has been noted in the history of present illness.  Physical Exam Triage Vital Signs ED Triage Vitals  Enc Vitals Group     BP 01/02/21 0827 (!) 147/82     Pulse Rate 01/02/21 0827 72     Resp 01/02/21 0827 18     Temp 01/02/21 0827 98.3 F (36.8 C)     Temp Source 01/02/21 0827 Oral     SpO2 01/02/21 0827 98 %     Weight --      Height --      Head Circumference --      Peak Flow --      Pain Score 01/02/21 0826 5     Pain Loc --      Pain Edu? --      Excl. in GC? --    Updated Vital Signs BP (!) 147/95 (BP Location: Left Arm)   Pulse 94   Temp 97.9 F (36.6 C) (Oral)   Resp 16   LMP 02/07/2022   SpO2 95%   Physical Exam  [facility] / Diagnostics / Procedures:     Radiology No results  found.  Procedures Procedures (including critical care time) EKG  Pending results:  Labs Reviewed  POCT URINALYSIS DIP (MANUAL ENTRY)    Medications Ordered in UC: Medications  ketorolac (TORADOL) 30 MG/ML injection 30 mg (30 mg Intramuscular Given 02/10/22 1909)    UC Diagnoses / Final Clinical Impressions(s)   I have reviewed the triage vital signs and the nursing notes.  Pertinent labs & imaging results that were available during my care of the patient were reviewed by me and considered in my medical decision making (see chart for details).    Final diagnoses:  Acute right-sided thoracic back pain    Patient was provided with an injection during their visit today for acute pain relief.  Patient was advised to:  Begin ibuprofen 800 mg 3 times daily and baclofen and 10 mg 3 times daily.  Note provided for work.  Patient advised to go the emergency room for fever, blood in urine, worsening pain despite treatment.  ED Prescriptions     Medication Sig Dispense Auth. Provider   baclofen (LIORESAL) 10 MG tablet Take 1 tablet (10 mg total) by mouth 3 (three) times daily for 7 days. 21 tablet Theadora Rama Scales, PA-C   ibuprofen (ADVIL) 800 MG tablet Take 1 tablet (800 mg total) by mouth every 8 (eight) hours as needed for up to 21 doses for fever, headache, mild pain or moderate pain. 21 tablet Theadora Rama Scales, PA-C      PDMP not reviewed this encounter.  Discharge Instructions:   Discharge Instructions      Your urinalysis is not concerning for kidney infection or kidney stone.  Physical exam is concerning for musculoskeletal pain.  Even though you have not had any acute injury or not aware of any reason for your pain, sometimes just having a little bit of pain can cause more pain by creating muscle tension which creates more pain.  You received an injection of ketorolac during her visit today which I hope provided you with some relief.  I sent a prescription for  ibuprofen 100 mg tablets and baclofen to your pharmacy.  Ibuprofen, as you know is an anti-inflammatory pain medication that can be taken every 8 hours.  Baclofen is a muscle relaxer and an antispasm medication that can be taken 3 times daily to relieve some of the  tension in your muscles that is causing pain.  I recommend that you alternate ice and heat to the affected area for the next few days and rest at home.  I provided you with a note to be out of work.  At any time, if you begin to have worsening pain, notice blood in your urine or begin to have fever, please go to the emergency right away for further evaluation.  You may require CT scan which is not something that we can order here at urgent care.  Thank you for coming to urgent care today.  I hope you feel better soon.      Disposition Upon Discharge:  Condition: stable for discharge home Home: take medications as prescribed; routine discharge instructions as discussed; follow up as advised.  Patient presented with an acute illness with associated systemic symptoms and significant discomfort requiring urgent management. In my opinion, this is a condition that a prudent lay person (someone who possesses an average knowledge of health and medicine) may potentially expect to result in complications if not addressed urgently such as respiratory distress, impairment of bodily function or dysfunction of bodily organs.   Routine symptom specific, illness specific and/or disease specific instructions were discussed with the patient and/or caregiver at length.   As such, the patient has been evaluated and assessed, work-up was performed and treatment was provided in alignment with urgent care protocols and evidence based medicine.  Patient/parent/caregiver has been advised that the patient may require follow up for further testing and treatment if the symptoms continue in spite of treatment, as clinically indicated and  appropriate.  Patient/parent/caregiver has been advised to report to orthopedic urgent care clinic or return to the [facility] or PCP in 3-5 days if no better; follow-up with orthopedics, PCP or the Emergency Department if new signs and symptoms develop or if the current signs or symptoms continue to change or worsen for further workup, evaluation and treatment as clinically indicated and appropriate  The patient will follow up with their current PCP if and as advised. If the patient does not currently have a PCP we will have assisted them in obtaining one.   The patient may need specialty follow up if the symptoms continue, in spite of conservative treatment and management, for further workup, evaluation, consultation and treatment as clinically indicated and appropriate.  Patient/parent/caregiver verbalized understanding and agreement of plan as discussed.  All questions were addressed during visit.  Please see discharge instructions below for further details of plan.  This office note has been dictated using Teaching laboratory technician.  Unfortunately, this method of dictation can sometimes lead to typographical or grammatical errors.  I apologize for your inconvenience in advance if this occurs.  Please do not hesitate to reach out to me if clarification is needed.      Theadora Rama Scales, PA-C 02/10/22 1918

## 2022-02-10 NOTE — ED Triage Notes (Signed)
Pt c/o right flank pain that began Sunday.   Home interventions: goody powder, motrin

## 2022-03-02 ENCOUNTER — Encounter (INDEPENDENT_AMBULATORY_CARE_PROVIDER_SITE_OTHER): Payer: Self-pay

## 2022-09-28 ENCOUNTER — Ambulatory Visit: Payer: BC Managed Care – PPO | Admitting: Internal Medicine

## 2022-10-19 ENCOUNTER — Encounter: Payer: Self-pay | Admitting: Internal Medicine

## 2022-10-19 ENCOUNTER — Ambulatory Visit: Payer: BC Managed Care – PPO | Admitting: Internal Medicine

## 2022-10-19 VITALS — BP 128/84 | HR 83 | Temp 98.3°F | Ht 67.0 in | Wt 244.0 lb

## 2022-10-19 DIAGNOSIS — E669 Obesity, unspecified: Secondary | ICD-10-CM | POA: Diagnosis not present

## 2022-10-19 DIAGNOSIS — M545 Low back pain, unspecified: Secondary | ICD-10-CM

## 2022-10-19 DIAGNOSIS — G8929 Other chronic pain: Secondary | ICD-10-CM | POA: Insufficient documentation

## 2022-10-19 DIAGNOSIS — G4733 Obstructive sleep apnea (adult) (pediatric): Secondary | ICD-10-CM | POA: Diagnosis not present

## 2022-10-19 DIAGNOSIS — I10 Essential (primary) hypertension: Secondary | ICD-10-CM | POA: Insufficient documentation

## 2022-10-19 LAB — COMPREHENSIVE METABOLIC PANEL
ALT: 33 U/L (ref 0–35)
AST: 30 U/L (ref 0–37)
Albumin: 4.2 g/dL (ref 3.5–5.2)
Alkaline Phosphatase: 41 U/L (ref 39–117)
BUN: 8 mg/dL (ref 6–23)
CO2: 26 mEq/L (ref 19–32)
Calcium: 9.7 mg/dL (ref 8.4–10.5)
Chloride: 101 mEq/L (ref 96–112)
Creatinine, Ser: 0.83 mg/dL (ref 0.40–1.20)
GFR: 84.89 mL/min (ref >60.00)
Glucose, Bld: 82 mg/dL (ref 70–99)
Potassium: 4.4 mEq/L (ref 3.5–5.1)
Sodium: 135 mEq/L (ref 135–145)
Total Bilirubin: 0.4 mg/dL (ref 0.2–1.2)
Total Protein: 6.9 g/dL (ref 6.0–8.3)

## 2022-10-19 LAB — LIPID PANEL
Cholesterol: 161 mg/dL (ref 0–200)
HDL: 39.4 mg/dL (ref >39.00)
LDL Cholesterol: 98 mg/dL (ref 0–99)
NonHDL: 122.09
Total CHOL/HDL Ratio: 4
Triglycerides: 121 mg/dL (ref 0.0–149.0)
VLDL: 24.2 mg/dL (ref 0.0–40.0)

## 2022-10-19 LAB — TSH: TSH: 0.93 u[IU]/mL (ref 0.35–5.50)

## 2022-10-19 LAB — HEMOGLOBIN A1C: Hgb A1c MFr Bld: 5.4 % (ref 4.6–6.5)

## 2022-10-19 MED ORDER — LOSARTAN POTASSIUM-HCTZ 50-12.5 MG PO TABS: 1.0000 | ORAL_TABLET | Freq: Every day | ORAL | 1 refills | Status: DC

## 2022-10-19 NOTE — Progress Notes (Signed)
[facility] [address] [place] [address] Dept: [phone] Dept Fax: [phone]  New Patient Office Visit  Subjective:   Julie Copeland 1975-05-29 10/19/2022  Chief Complaint  Patient presents with   Establish Care    Back pain, lab work    HPI: Julie Copeland presents today to establish care at [facility] [employer]. Introduced to Publishing rights manager role and practice setting.  All questions answered.  Concerns: See below   Discussed the use of AI scribe software for clinical note transcription with the patient, who gave verbal consent to proceed.  History of Present Illness   The patient, a teacher presents to establish care. She has a history of high blood pressure, managed on Losartan- HCTZ PO daily. She is compliant with medication. She also has hx of sleep apnea during pregnancy, has not had sleep study in approximately 11 years. She has been told from her family that she still has apneic episodes while sleeping. She does report daytime fatigue, but unsure if it is related to sleep apnea or recent stress with job changes. She also presents with lower back pain that started a month ago. The pain was triggered by moving items around in the classroom. The pain is described as a dull ache in the lower back, radiating into the buttock area. The patient has been managing the pain with a muscle relaxer (methocarbamol) prescribed by [facility]. The patient reports that the medication helps when taken consistently, but the pain persists. She has an appointment with PT on August 19th.    Family hx of heart disease, she desires to minimize her risk of developing in the future. She is trying to eat a healthy diet. She walks approximately 3 miles per day. She is interested in what she can to do help with weight loss and healthy living.       The following portions of the patient's  history were reviewed and updated as appropriate: past medical history, past surgical history, family history, social history, allergies, medications, and problem list.   Patient Active Problem List   Diagnosis Date Noted   Chronic back pain 10/19/2022   Obesity (BMI 30-39.9) 10/19/2022   Primary hypertension 10/19/2022   OSA (obstructive sleep apnea) 10/09/2010   Past Medical History:  Diagnosis Date   Abnormal MSAFP (maternal serum alpha-fetoprotein), elevated 11/12/2014   4.86 MoM     Childhood asthma    Chlamydia    Hypertension in pregnancy    No pertinent past medical history    Sleep apnea    with pregnancy   Vaginal delivery 2002, 2007, 2012   Past Surgical History:  Procedure Laterality Date   DILATION AND EVACUATION N/A 11/20/2014   Procedure: DILATATION AND EVACUATION (D&E) 2ND TRIMESTER;  Surgeon: Levi Aland, MD;  Location: [facility];  Service: Gynecology;  Laterality: N/A;   INDUCED ABORTION  2000 and 2006   WISDOM TOOTH EXTRACTION  1997   Family History  Problem Relation Age of Onset   Heart disease Mother    Heart disease Father    Asthma Brother    Outpatient Medications Prior to Visit  Medication Sig Dispense Refill   Cholecalciferol (VITAMIN D) 50 MCG (2000 UT) tablet 1 tablet     ibuprofen (ADVIL) 800 MG tablet Take 1 tablet (800 mg total) by mouth every 8 (eight) hours as needed for up to 21 doses for fever, headache, mild pain or moderate pain. 21 tablet  0   methocarbamol (ROBAXIN) 500 MG tablet Take 500 mg by mouth 3 (three) times daily as needed.     losartan-hydrochlorothiazide (HYZAAR) 50-12.5 MG tablet 1 tablet     norethindrone-ethinyl estradiol (FEMHRT 1/5) 1-5 MG-MCG TABS tablet Take 1 tablet by mouth daily.     No facility-administered medications prior to visit.   Allergies  Allergen Reactions   Methylergonovine Itching   Other Hives, Itching and Other (See Comments)    Pt states that she is allergic to a variety of nuts.      Terconazole Itching, Swelling and Rash    ROS: A complete ROS was performed with pertinent positives/negatives noted in the HPI. The remainder of the ROS are negative.   Objective:   Today's Vitals   10/19/22 0903  BP: 128/84  Pulse: 83  Temp: 98.3 F (36.8 C)  TempSrc: Temporal  SpO2: 99%  Weight: 244 lb (110.7 kg)  Height: 5\' 7"  (1.702 m)    GENERAL: Well-appearing, in NAD. Well nourished.  SKIN: Pink, warm and dry. No rash.  NECK: Trachea midline. Full ROM w/o pain or tenderness. No lymphadenopathy. No thyromegaly.  RESPIRATORY: Chest wall symmetrical. Respirations even and non-labored. Breath sounds clear to auscultation bilaterally.  CARDIAC: S1, S2 present, regular rate and rhythm. Peripheral pulses 2+ bilaterally.  MSK: Muscle tone and strength appropriate for age. Pain to bilateral lower lumbar paraspinal region  EXTREMITIES: Without clubbing, cyanosis, or edema.  NEUROLOGIC: No sensory deficits. Steady, even gait.  PSYCH/MENTAL STATUS: Alert, oriented x 3. Cooperative, appropriate mood and affect.   Health Maintenance Due  Topic Date Due   Hepatitis C Screening  Never done   PAP SMEAR-Modifier  02/18/2019   DTaP/Tdap/Td (2 - Td or Tdap) 12/02/2020   Colonoscopy  Never done   INFLUENZA VACCINE  10/07/2022    No results found for any visits on 10/19/22.  Assessment & Plan:  Assessment and Plan    Hypertension Controlled on Losartan/Hydrochlorothiazide 50/12.5mg  daily. Patient checks blood pressure at home when feeling symptomatic. -Refill Losartan/Hydrochlorothiazide 50/12.5mg  daily.  Lower Back Pain Recent onset, possibly related to physical activity. Evaluated at [facility], prescribed Methocarbamol and Meloxicam. Patient has been taking Methocarbamol with some relief, but has not started Meloxicam due to concerns about side effects. Physical therapy scheduled. -Continue Methocarbamol as needed. -Start physical therapy on 10/25/2022. -Consider chiropractic  therapy if no improvement with physical therapy.  Sleep Apnea Diagnosed during pregnancy, currently experiencing symptoms of loud snoring and episodes of apnea during sleep. Daytime tiredness present, but may be related to current life stressors. -Refer for sleep study.  Weight Management/Obesity Patient expressed concern about weight gain. -Recommend diet modifications (low carb, high protein diet) and increased physical activity, including weight training. -Consider referral to [facility] if needed.  General Health Maintenance -Order fasting blood work today, including thyroid level, kidney and liver function, electrolytes, cholesterol, and A1c. -Plan for follow-up in three months to update immunizations and screenings as needed.         Orders Placed This Encounter  Procedures   Comprehensive metabolic panel   Hemoglobin A1c   Lipid panel   TSH   Ambulatory referral to Sleep Studies    Referral Priority:   Routine    Referral Type:   Consultation    Referral Reason:   Specialty Services Required    Number of Visits Requested:   1   Meds ordered this encounter  Medications   losartan-hydrochlorothiazide (HYZAAR) 50-12.5 MG tablet  Sig: Take 1 tablet by mouth daily.    Dispense:  90 tablet    Refill:  1    Order Specific Question:   Supervising Provider    Answer:   Garnette Gunner [[id number]]     Return in about 3 months (around 01/19/2023) for Fasting Annual Physical Exam.   Of note, portions of this note may have been created with voice recognition software [employer]). While this note has been edited for accuracy, occasional wrong-word or 'sound-a-like' substitutions may have occurred due to the inherent limitations of voice recognition software.  Salvatore Decent, FNP

## 2022-11-18 LAB — HM PAP SMEAR

## 2023-01-19 ENCOUNTER — Encounter: Payer: BC Managed Care – PPO | Admitting: Internal Medicine

## 2023-02-17 NOTE — Progress Notes (Unsigned)
Subjective:   Julie Copeland 1975-11-23  02/18/2023   CC: Chief Complaint  Patient presents with   Annual Exam    fasting    HPI: Julie Copeland is a 47 y.o. female who presents for a routine health maintenance exam.  Labs collected at time of visit.   Discussed the use of AI scribe software for clinical note transcription with the patient, who gave verbal consent to proceed.  History of Present Illness   The patient, with a history of hypertension, presents with recent episodes of elevated blood pressure readings at home, reaching as high as 157/114. She reports associated left-sided neck and shoulder pain, and describes a sensation of her "whole left arm hurting." She also experienced nausea on one occasion. The patient has been managing these symptoms with rest, hydration, and over-the-counter Goody's powder. She denies any changes in diet or salt intake, and has not noticed any vision changes or palpitations.  The patient has a family history of heart disease, with her father having a heart attack at age 27. She expresses a desire to be vigilant about her cardiovascular health, and is open to further testing.  In addition to the cardiovascular concerns, the patient also reports intermittent chest pain, which she attributes to the weight of her large breasts. She has not noticed any lumps or changes in her breasts.  The patient is due for a colonoscopy, which she is willing to schedule. She recently had a Pap smear and mammogram, and is up to date on her tetanus vaccination.        HEALTH SCREENINGS: - Pap smear: done elsewhere - OBGYN - Mammogram (40+): Done elsewhere  OBGYN  - Colonoscopy (45+): Ordered today  - Bone Density (65+): Not applicable  - Lung CA screening with low-dose CT:  Not applicable Adults age 15-80 who are current cigarette smokers or quit within the last 15 years. Must have 20 pack year history.   Depression and Anxiety Screen  done today and results listed below:     02/18/2023    9:44 AM 10/19/2022    9:08 AM  Depression screen PHQ 2/9  Decreased Interest 1 1  Down, Depressed, Hopeless 0 1  PHQ - 2 Score 1 2  Altered sleeping 1 2  Tired, decreased energy 1 2  Change in appetite 0 1  Feeling bad or failure about yourself  1 1  Trouble concentrating 1 1  Moving slowly or fidgety/restless 0 0  Suicidal thoughts 0 0  PHQ-9 Score 5 9  Difficult doing work/chores Not difficult at all Somewhat difficult      02/18/2023    9:48 AM 10/19/2022    9:09 AM  GAD 7 : Generalized Anxiety Score  Nervous, Anxious, on Edge 1 0  Control/stop worrying 0 1  Worry too much - different things 1 1  Trouble relaxing 0 1  Restless 0 1  Easily annoyed or irritable 1 1  Afraid - awful might happen 0 0  Total GAD 7 Score 3 5  Anxiety Difficulty Somewhat difficult Not difficult at all    IMMUNIZATIONS: - Tdap: Tetanus vaccination status reviewed:due , done today - HPV: Not applicable - Influenza: declined    Past medical history, surgical history, medications, allergies, family history and social history reviewed with patient today and changes made to appropriate areas of the chart.   Past Medical History:  Diagnosis Date   Abnormal MSAFP (maternal serum alpha-fetoprotein), elevated 11/12/2014   4.86 MoM  Childhood asthma    Chlamydia    Hypertension in pregnancy    No pertinent past medical history    Sleep apnea    with pregnancy   Vaginal delivery 2002, 2007, 2012    Past Surgical History:  Procedure Laterality Date   DILATION AND EVACUATION N/A 11/20/2014   Procedure: DILATATION AND EVACUATION (D&E) 2ND TRIMESTER;  Surgeon: Levi Aland, MD;  Location: [facility];  Service: Gynecology;  Laterality: N/A;   INDUCED ABORTION  2000 and 2006   WISDOM TOOTH EXTRACTION  1997    Current Outpatient Medications on File Prior to Visit  Medication Sig   Cholecalciferol (VITAMIN D) 50 MCG (2000 UT) tablet 1  tablet   ibuprofen (ADVIL) 800 MG tablet Take 1 tablet (800 mg total) by mouth every 8 (eight) hours as needed for up to 21 doses for fever, headache, mild pain or moderate pain.   losartan-hydrochlorothiazide (HYZAAR) 50-12.5 MG tablet Take 1 tablet by mouth daily.   norethindrone-ethinyl estradiol (FEMHRT 1/5) 1-5 MG-MCG TABS tablet Take 1 tablet by mouth daily.   No current facility-administered medications on file prior to visit.    Allergies  Allergen Reactions   Methylergonovine Itching   Other Hives, Itching and Other (See Comments)    Pt states that she is allergic to a variety of nuts.     Terconazole Itching, Swelling and Rash     Social History   Socioeconomic History   Marital status: Single    Spouse name: Not on file   Number of children: 2   Years of education: Not on file   Highest education level: Not on file  Occupational History   Occupation: Teacher  Tobacco Use   Smoking status: Never   Smokeless tobacco: Never  Substance and Sexual Activity   Alcohol use: Yes   Drug use: No   Sexual activity: Yes  Other Topics Concern   Not on file  Social History Narrative   Not on file   Social Drivers of Health   Financial Resource Strain: Not on file  Food Insecurity: Not on file  Transportation Needs: Not on file  Physical Activity: Not on file  Stress: Not on file  Social Connections: Not on file  Intimate Partner Violence: Not on file   Social History   Tobacco Use  Smoking Status Never  Smokeless Tobacco Never   Social History   Substance and Sexual Activity  Alcohol Use Yes    Family History  Problem Relation Age of Onset   Heart disease Mother    Heart disease Father    Asthma Brother      ROS: Denies fever, fatigue, unexplained weight loss/gain, hearing or vision changes, cardiac or respiratory complaints. Denies neurological deficits, musculoskeletal complaints, gastrointestinal or genitourinary complaints, mental health  complaints, and skin changes.   Objective:   Today's Vitals   02/18/23 0940  BP: 126/86  Pulse: 83  Temp: 99.8 F (37.7 C)  TempSrc: Temporal  SpO2: 100%  Weight: 242 lb (109.8 kg)  Height: 5\' 7"  (1.702 m)    GENERAL APPEARANCE: Well-appearing, in NAD. Well nourished.  SKIN: Pink, warm and dry. Turgor normal. No rash, lesion, ulceration, or ecchymoses. Hair evenly distributed.  HEENT: HEAD: Normocephalic.  EYES: PERRLA. EOMI. Lids intact w/o defect. Sclera white, Conjunctiva pink w/o exudate.  EARS: External ear w/o redness, swelling, masses or lesions. EAC clear. TM's intact, translucent w/o bulging, appropriate landmarks visualized. Appropriate acuity to conversational tones.  NOSE: Septum midline w/o  deformity. Nares patent, mucosa pink and non-inflamed w/o drainage. No sinus tenderness.  THROAT: Uvula midline. Oropharynx clear. Tonsils non-inflamed w/o exudate . Oral mucosa pink and moist.  NECK: Supple, Trachea midline. Full ROM w/o pain or tenderness. No lymphadenopathy. Thyroid non-tender w/o enlargement or palpable masses.  BREASTS: Breasts pendulous, symmetrical, and w/o palpable masses. Nipples everted and w/o discharge. No rash or skin retraction. No axillary or supraclavicular lymphadenopathy.  RESPIRATORY: Chest wall symmetrical w/o masses. Respirations even and non-labored. Breath sounds clear to auscultation bilaterally. No wheezes, rales, rhonchi, or crackles. CARDIAC: S1, S2 present, regular rate and rhythm. No gallops, murmurs, rubs, or clicks. No carotid bruits. Capillary refill <2 seconds. Peripheral pulses 2+ bilaterally. GI: Abdomen soft w/o distention. Normoactive bowel sounds. No palpable masses or tenderness. No guarding or rebound tenderness. Liver and spleen w/o tenderness or enlargement. No CVA tenderness.  MSK: Muscle tone and strength appropriate for age, w/o atrophy or abnormal movement.  EXTREMITIES: Active ROM intact, w/o tenderness, crepitus, or  contracture. No obvious joint deformities or effusions. No clubbing, edema, or cyanosis.  NEUROLOGIC: CN's II-XII intact. Motor strength symmetrical with no obvious weakness. No sensory deficits. DTR's 2+ symmetric bilaterally. Steady, even gait.  PSYCH/MENTAL STATUS: Alert, oriented x 3. Cooperative, appropriate mood and affect.   EKG RESULT: EKG tracing is personally reviewed.   EKG: normal EKG, normal sinus rhythm.   Assessment & Plan:  Assessment and Plan    Hypertension Recent episodes of elevated blood pressure at home with associated left-sided neck and arm pain, and nausea. Family history of early heart attack. No current chest pain. Blood pressure controlled in office today. -Perform EKG today to evaluate for possible cardiac ischemia. -Order cardiac enzyme test to rule out recent myocardial infarction. -If symptoms persist or if abnormal results, refer to cardiology for possible stress test.  Preventive Health Up to date on Pap smear and mammogram. Declined Hepatitis C screening. -Administer tetanus vaccine today. -Order colonoscopy for colorectal cancer screening.  Follow-up Await EKG and lab results. Discuss results and next steps with patient.        Encounter for general adult medical examination without abnormal findings -     CBC with Differential/Platelet -     Comprehensive metabolic panel -     TSH -     Lipid panel  Colon cancer screening -     Ambulatory referral to Gastroenterology  Immunization due -     Tdap vaccine greater than or equal to 7yo IM  Chest pain, unspecified type -     EKG 12-Lead -     Troponin I (High Sensitivity)    Orders Placed This Encounter  Procedures   Tdap vaccine greater than or equal to 7yo IM   CBC with Differential/Platelet   Comprehensive metabolic panel   TSH   Lipid panel   Ambulatory referral to Gastroenterology    Referral Priority:   Routine    Referral Type:   Consultation    Referral Reason:    Specialty Services Required    Number of Visits Requested:   1   EKG 12-Lead    PATIENT COUNSELING:  - Encouraged a healthy well-balanced diet. Patient may adjust caloric intake to maintain or achieve ideal body weight. May reduce intake of dietary saturated fat and total fat and have adequate dietary potassium and calcium preferably from fresh fruits, vegetables, and low-fat dairy products.   - Advised to avoid cigarette smoking. - Discussed with the patient that most people either  abstain from alcohol or drink within safe limits (<=14/week and <=4 drinks/occasion for males, <=7/weeks and <= 3 drinks/occasion for females) and that the risk for alcohol disorders and other health effects rises proportionally with the number of drinks per week and how often a drinker exceeds daily limits. - Discussed cessation/primary prevention of drug use and availability of treatment for abuse.  - Discussed sexually transmitted diseases, avoidance of unintended pregnancy and contraceptive alternatives.  - Stressed the importance of regular exercise - Injury prevention: Discussed safety belts, safety helmets, smoke detector, smoking near bedding or upholstery.  - Dental health: Discussed importance of regular tooth brushing, flossing, and dental visits.   NEXT PREVENTATIVE PHYSICAL DUE IN 1 YEAR.  Return in about 6 months (around 08/19/2023) for Chronic Condition follow up, and as needed .  Salvatore Decent, FNP

## 2023-02-18 ENCOUNTER — Encounter: Payer: Self-pay | Admitting: Internal Medicine

## 2023-02-18 ENCOUNTER — Ambulatory Visit (INDEPENDENT_AMBULATORY_CARE_PROVIDER_SITE_OTHER): Payer: BC Managed Care – PPO | Admitting: Internal Medicine

## 2023-02-18 VITALS — BP 126/86 | HR 83 | Temp 99.8°F | Ht 67.0 in | Wt 242.0 lb

## 2023-02-18 DIAGNOSIS — R079 Chest pain, unspecified: Secondary | ICD-10-CM | POA: Diagnosis not present

## 2023-02-18 DIAGNOSIS — I10 Essential (primary) hypertension: Secondary | ICD-10-CM

## 2023-02-18 DIAGNOSIS — Z23 Encounter for immunization: Secondary | ICD-10-CM

## 2023-02-18 DIAGNOSIS — Z0001 Encounter for general adult medical examination with abnormal findings: Secondary | ICD-10-CM

## 2023-02-18 DIAGNOSIS — Z1211 Encounter for screening for malignant neoplasm of colon: Secondary | ICD-10-CM | POA: Diagnosis not present

## 2023-02-18 DIAGNOSIS — Z124 Encounter for screening for malignant neoplasm of cervix: Secondary | ICD-10-CM

## 2023-02-18 DIAGNOSIS — Z0000 Encounter for general adult medical examination without abnormal findings: Secondary | ICD-10-CM

## 2023-02-18 DIAGNOSIS — Z1159 Encounter for screening for other viral diseases: Secondary | ICD-10-CM

## 2023-02-18 DIAGNOSIS — Z1231 Encounter for screening mammogram for malignant neoplasm of breast: Secondary | ICD-10-CM

## 2023-02-18 LAB — COMPREHENSIVE METABOLIC PANEL
ALT: 19 U/L (ref 0–35)
AST: 18 U/L (ref 0–37)
Albumin: 4.2 g/dL (ref 3.5–5.2)
Alkaline Phosphatase: 46 U/L (ref 39–117)
BUN: 8 mg/dL (ref 6–23)
CO2: 28 meq/L (ref 19–32)
Calcium: 9.1 mg/dL (ref 8.4–10.5)
Chloride: 102 meq/L (ref 96–112)
Creatinine, Ser: 0.82 mg/dL (ref 0.40–1.20)
GFR: 85.93 mL/min (ref >60.00)
Glucose, Bld: 71 mg/dL (ref 70–99)
Potassium: 4.1 meq/L (ref 3.5–5.1)
Sodium: 138 meq/L (ref 135–145)
Total Bilirubin: 0.5 mg/dL (ref 0.2–1.2)
Total Protein: 6.9 g/dL (ref 6.0–8.3)

## 2023-02-18 LAB — CBC WITH DIFFERENTIAL/PLATELET
Basophils Absolute: 0 10*3/uL (ref 0.0–0.1)
Basophils Relative: 0.7 % (ref 0.0–3.0)
Eosinophils Absolute: 0.2 10*3/uL (ref 0.0–0.7)
Eosinophils Relative: 3.2 % (ref 0.0–5.0)
HCT: 41.2 % (ref 36.0–46.0)
Hemoglobin: 13.8 g/dL (ref 12.0–15.0)
Lymphocytes Relative: 34.6 % (ref 12.0–46.0)
Lymphs Abs: 2.2 10*3/uL (ref 0.7–4.0)
MCHC: 33.4 g/dL (ref 30.0–36.0)
MCV: 96.5 fL (ref 78.0–100.0)
Monocytes Absolute: 0.6 10*3/uL (ref 0.1–1.0)
Monocytes Relative: 9.6 % (ref 3.0–12.0)
Neutro Abs: 3.2 10*3/uL (ref 1.4–7.7)
Neutrophils Relative %: 51.9 % (ref 43.0–77.0)
Platelets: 293 10*3/uL (ref 150.0–400.0)
RBC: 4.27 Mil/uL (ref 3.87–5.11)
RDW: 13 % (ref 11.5–15.5)
WBC: 6.2 10*3/uL (ref 4.0–10.5)

## 2023-02-18 LAB — TSH: TSH: 1.1 u[IU]/mL (ref 0.35–5.50)

## 2023-02-18 LAB — LIPID PANEL
Cholesterol: 156 mg/dL (ref 0–200)
HDL: 43.2 mg/dL (ref >39.00)
LDL Cholesterol: 94 mg/dL (ref 0–99)
NonHDL: 112.65
Total CHOL/HDL Ratio: 4
Triglycerides: 93 mg/dL (ref 0.0–149.0)
VLDL: 18.6 mg/dL (ref 0.0–40.0)

## 2023-02-18 LAB — TROPONIN I (HIGH SENSITIVITY): High Sens Troponin I: 16 ng/L (ref 2–17)

## 2023-02-22 ENCOUNTER — Encounter: Payer: Self-pay | Admitting: Internal Medicine

## 2023-02-23 ENCOUNTER — Encounter: Payer: Self-pay | Admitting: Internal Medicine

## 2023-09-29 ENCOUNTER — Other Ambulatory Visit (HOSPITAL_COMMUNITY)
Admission: RE | Admit: 2023-09-29 | Discharge: 2023-09-29 | Disposition: A | Discharge status: Home / Self Care | Source: Ambulatory Visit | Attending: Internal Medicine | Admitting: Internal Medicine

## 2023-09-29 ENCOUNTER — Encounter: Payer: Self-pay | Admitting: Internal Medicine

## 2023-09-29 ENCOUNTER — Ambulatory Visit: Payer: Self-pay | Admitting: Family Medicine

## 2023-09-29 ENCOUNTER — Ambulatory Visit (INDEPENDENT_AMBULATORY_CARE_PROVIDER_SITE_OTHER): Admitting: Internal Medicine

## 2023-09-29 ENCOUNTER — Ambulatory Visit: Payer: Self-pay

## 2023-09-29 VITALS — BP 134/2 | HR 94 | Temp 98.9°F | Ht 67.0 in | Wt 242.2 lb

## 2023-09-29 DIAGNOSIS — R102 Pelvic and perineal pain: Secondary | ICD-10-CM | POA: Diagnosis present

## 2023-09-29 LAB — POC URINALSYSI DIPSTICK (AUTOMATED)
Bilirubin, UA: NEGATIVE
Blood, UA: NEGATIVE
Glucose, UA: NEGATIVE
Ketones, UA: NEGATIVE
Leukocytes, UA: NEGATIVE
Nitrite, UA: NEGATIVE
Protein, UA: NEGATIVE
Spec Grav, UA: 1.015 (ref 1.010–1.025)
Urobilinogen, UA: 0.2 U/dL
pH, UA: 6 (ref 5.0–8.0)

## 2023-09-29 LAB — POCT URINE PREGNANCY: Preg Test, Ur: NEGATIVE

## 2023-09-29 NOTE — Progress Notes (Unsigned)
 [facility] PRIMARY CARE [facility] [address] [place] [address] Dept: [phone] Dept Fax: [phone]  Acute Care Office Visit  Subjective:   Julie Copeland [facility] 05-13-1975 09/29/2023  Chief Complaint  Patient presents with   Pelvic Pain    Started Tuesday     HPI:  Discussed the use of AI scribe software for clinical note transcription with the patient, who gave verbal consent to proceed.  History of Present Illness   Julie Copeland is a 48 year old female who presents with pelvic pain.  She has been experiencing a low, consistent aching pain in the lower abdomen, primarily on the left side, since Tuesday. The pain began suddenly without any preceding physical activity or strain. It is described as a dull ache, rated at a level seven or eight, and has been steady over the past few days, occasionally worsening with certain positions or movements.  The pain was severe enough to disrupt her sleep at around 4 AM, prompting her to take Motrin . She initially took one 200 mg tablet without relief, followed by a second tablet, which provided minimal comfort. She also attempted using a heat pack, suspecting a muscle strain, but the pain persisted.  She recalls experiencing similar pain during her pregnancies, which she attributed to hormonal changes affecting her ovaries. Her last menstrual period was in late June, and she has not experienced any spotting or bleeding since then, which is unusual for her cycle.  No fever, nausea, vomiting, diarrhea, constipation, vaginal bleeding, or discharge. She is sexually active, currently on femHRT. She has not missed any doses.  Her past medical history includes a termination of pregnancy in 2017 due to a non-developing pregnancy. She has not had any surgeries like tubal ligation or hysterectomy. No new sexual partners or concerns for STDs. She has one female partner.        The following  portions of the patient's history were reviewed and updated as appropriate: past medical history, past surgical history, family history, social history, allergies, medications, and problem list.   Patient Active Problem List   Diagnosis Date Noted   Uterine leiomyoma 10/03/2023   Cyst of right ovary 10/03/2023   Chronic back pain 10/19/2022   Obesity (BMI 30-39.9) 10/19/2022   Primary hypertension 10/19/2022   OSA (obstructive sleep apnea) 10/09/2010   Past Medical History:  Diagnosis Date   Abnormal MSAFP (maternal serum alpha-fetoprotein), elevated 11/12/2014   4.86 MoM     Childhood asthma    Chlamydia    Hypertension in pregnancy    No pertinent past medical history    Sleep apnea    with pregnancy   Vaginal delivery 2002, 2007, 2012   Past Surgical History:  Procedure Laterality Date   DILATION AND EVACUATION N/A 11/20/2014   Procedure: DILATATION AND EVACUATION (D&E) 2ND TRIMESTER;  Surgeon: Oneil FORBES Piety, MD;  Location: [facility];  Service: Gynecology;  Laterality: N/A;   INDUCED ABORTION  2000 and 2006   WISDOM TOOTH EXTRACTION  1997   Family History  Problem Relation Age of Onset   Heart disease Mother    Heart disease Father    Asthma Brother     Current Outpatient Medications:    Cholecalciferol (VITAMIN D) 50 MCG (2000 UT) tablet, 1 tablet, Disp: , Rfl:    ibuprofen  (ADVIL ) 800 MG tablet, Take 1 tablet (800 mg total) by mouth every 8 (eight) hours as needed for up to 21 doses for fever, headache, mild pain  or moderate pain., Disp: 21 tablet, Rfl: 0   losartan -hydrochlorothiazide (HYZAAR) 50-12.5 MG tablet, Take 1 tablet by mouth daily., Disp: 90 tablet, Rfl: 1   norethindrone-ethinyl estradiol (FEMHRT 1/5) 1-5 MG-MCG TABS tablet, Take 1 tablet by mouth daily., Disp: , Rfl:  Allergies  Allergen Reactions   Methylergonovine Itching   Other Hives, Itching and Other (See Comments)    Pt states that she is allergic to a variety of nuts.     Terconazole Itching,  Swelling and Rash     ROS: A complete ROS was performed with pertinent positives/negatives noted in the HPI. The remainder of the ROS are negative.    Objective:   Today's Vitals   09/29/23 1500  BP: (!) 134/2  Pulse: 94  Temp: 98.9 F (37.2 C)  TempSrc: Temporal  SpO2: 94%  Weight: 242 lb 3.2 oz (109.9 kg)  Height: 5' 7 (1.702 m)    GENERAL: Well-appearing, in NAD. Well nourished.  SKIN: Pink, warm and dry. No rash, lesion, ulceration, or ecchymoses.  NECK: Trachea midline. Full ROM w/o pain or tenderness. No lymphadenopathy.  RESPIRATORY: Chest wall symmetrical. Respirations even and non-labored. Breath sounds clear to auscultation bilaterally.  CARDIAC: S1, S2 present, regular rate and rhythm. Peripheral pulses 2+ bilaterally.  GI: Abdomen soft, non-tender. Normoactive bowel sounds. No rebound tenderness. No hepatomegaly or splenomegaly. No CVA tenderness.  GU: External genitalia without erythema, lesions, or masses. No lymphadenopathy. Vaginal mucosa pink and moist without exudate, lesions, or ulcerations. Cervix pink without discharge. Cervical os closed. Uterus and adnexae palpable, not enlarged, and tenderness to left adnexa region.  No palpable masses. Chaperoned by Angelyn Hollingsworth CMA EXTREMITIES: Without clubbing, cyanosis, or edema.  NEUROLOGIC: No motor or sensory deficits. Steady, even gait.  PSYCH/MENTAL STATUS: Alert, oriented x 3. Cooperative, appropriate mood and affect.    Results for orders placed or performed in visit on 09/29/23  POCT Urinalysis Dipstick (Automated)  Result Value Ref Range   Color, UA Dark Yellow    Clarity, UA Clear    Glucose, UA Negative Negative   Bilirubin, UA Negative    Ketones, UA Negative    Spec Grav, UA 1.015 1.010 - 1.025   Blood, UA Negative    pH, UA 6.0 5.0 - 8.0   Protein, UA Negative Negative   Urobilinogen, UA 0.2 0.2 or 1.0 E.U./dL   Nitrite, UA Negative    Leukocytes, UA Negative Negative  POCT urine pregnancy   Result Value Ref Range   Preg Test, Ur Negative Negative  Cervicovaginal ancillary only  Result Value Ref Range   Neisseria Gonorrhea Negative    Chlamydia Negative    Trichomonas Negative    Bacterial Vaginitis (gardnerella) Negative    Candida Vaginitis Negative    Candida Glabrata Negative    Comment      Normal Reference Range Bacterial Vaginosis - Negative   Comment Normal Reference Range Candida Species - Negative    Comment Normal Reference Range Candida Galbrata - Negative    Comment Normal Reference Range Trichomonas - Negative    Comment Normal Reference Ranger Chlamydia - Negative    Comment      Normal Reference Range Neisseria Gonorrhea - Negative      Assessment & Plan:  Assessment and Plan    Pelvic Pain Differential includes ovarian cysts. Negative pregnancy test and UTI screening. Pelvic tenderness noted. - POC preg  - POC UA  - Cervicovaginal swab for STD's, BV, and yeast - Order pelvic ultrasound -  Instruct to go to the emergency room if pain worsens before ultrasound.     Orders Placed This Encounter  Procedures   US  Pelvic Complete With Transvaginal    Standing Status:   Future    Number of Occurrences:   1    Expiration Date:   09/28/2024    Reason for Exam (SYMPTOM  OR DIAGNOSIS REQUIRED):   acute left lower pelvic pain    Preferred imaging location?:   [facility]   POCT Urinalysis Dipstick (Automated)   POCT urine pregnancy   Lab Orders         POCT Urinalysis Dipstick (Automated)         POCT urine pregnancy     No images are attached to the encounter or orders placed in the encounter.  No follow-ups on file.   Rosina Senters, FNP

## 2023-09-30 ENCOUNTER — Ambulatory Visit (HOSPITAL_BASED_OUTPATIENT_CLINIC_OR_DEPARTMENT_OTHER)
Admission: RE | Admit: 2023-09-30 | Discharge: 2023-09-30 | Disposition: A | Discharge status: Home / Self Care | Source: Ambulatory Visit | Attending: Internal Medicine | Admitting: Internal Medicine

## 2023-09-30 DIAGNOSIS — R102 Pelvic and perineal pain: Secondary | ICD-10-CM | POA: Insufficient documentation

## 2023-10-03 ENCOUNTER — Ambulatory Visit: Payer: Self-pay | Admitting: Internal Medicine

## 2023-10-03 DIAGNOSIS — D259 Leiomyoma of uterus, unspecified: Secondary | ICD-10-CM | POA: Insufficient documentation

## 2023-10-03 DIAGNOSIS — R102 Pelvic and perineal pain: Secondary | ICD-10-CM

## 2023-10-03 DIAGNOSIS — N83201 Unspecified ovarian cyst, right side: Secondary | ICD-10-CM | POA: Insufficient documentation

## 2023-10-03 LAB — CERVICOVAGINAL ANCILLARY ONLY
Bacterial Vaginitis (gardnerella): NEGATIVE
Candida Glabrata: NEGATIVE
Candida Vaginitis: NEGATIVE
Chlamydia: NEGATIVE
Comment: NEGATIVE
Comment: NEGATIVE
Comment: NEGATIVE
Comment: NEGATIVE
Comment: NEGATIVE
Comment: NORMAL
Neisseria Gonorrhea: NEGATIVE
Trichomonas: NEGATIVE

## 2023-10-10 ENCOUNTER — Encounter (HOSPITAL_BASED_OUTPATIENT_CLINIC_OR_DEPARTMENT_OTHER): Admitting: Obstetrics & Gynecology

## 2023-12-01 ENCOUNTER — Other Ambulatory Visit: Payer: Self-pay | Admitting: Internal Medicine

## 2023-12-01 DIAGNOSIS — I10 Essential (primary) hypertension: Secondary | ICD-10-CM

## 2023-12-01 NOTE — Telephone Encounter (Unsigned)
 Copied from CRM #[id number]. Topic: Clinical - Medication Refill >> Dec 01, 2023  4:30 PM Thersia C wrote: Medication: losartan -hydrochlorothiazide (HYZAAR) 50-12.5 MG tablet  Has the patient contacted their pharmacy? Yes (Agent: If no, request that the patient contact the pharmacy for the refill. If patient does not wish to contact the pharmacy document the reason why and proceed with request.) (Agent: If yes, when and what did the pharmacy advise?)  This is the patient's preferred pharmacy:  [id number] [place], [place] - [address] AT [facility] OF [facility] & [address] [place] [address] Phone: [phone] Fax: [phone]  Is this the correct pharmacy for this prescription? Yes If no, delete pharmacy and type the correct one.   Has the prescription been filled recently? No  Is the patient out of the medication? Yes  Has the patient been seen for an appointment in the last year OR does the patient have an upcoming appointment? Yes  Can we respond through MyChart? Yes  Agent: Please be advised that Rx refills may take up to 3 business days. We ask that you follow-up with your pharmacy.

## 2023-12-02 MED ORDER — LOSARTAN POTASSIUM-HCTZ 50-12.5 MG PO TABS: 1.0000 | ORAL_TABLET | Freq: Every day | ORAL | 0 refills | Status: AC
